# Patient Record
Sex: Female | Born: 1980 | Race: White | Hispanic: No | Marital: Married | Smoking: Former smoker
Health system: Southern US, Community
[De-identification: ages and names within clinical notes are randomized; demographics above are authoritative.]

## PROBLEM LIST (undated history)

## (undated) DIAGNOSIS — R87619 Unspecified abnormal cytological findings in specimens from cervix uteri: Secondary | ICD-10-CM

## (undated) DIAGNOSIS — B977 Papillomavirus as the cause of diseases classified elsewhere: Secondary | ICD-10-CM

## (undated) DIAGNOSIS — F419 Anxiety disorder, unspecified: Secondary | ICD-10-CM

## (undated) DIAGNOSIS — D242 Benign neoplasm of left breast: Secondary | ICD-10-CM

## (undated) DIAGNOSIS — K219 Gastro-esophageal reflux disease without esophagitis: Secondary | ICD-10-CM

## (undated) HISTORY — PX: TONSILLECTOMY AND ADENOIDECTOMY: SUR1326

## (undated) HISTORY — PX: REFRACTIVE SURGERY: SHX103

## (undated) HISTORY — DX: Papillomavirus as the cause of diseases classified elsewhere: B97.7

## (undated) HISTORY — PX: LEEP: SHX91

## (undated) HISTORY — DX: Unspecified abnormal cytological findings in specimens from cervix uteri: R87.619

---

## 2001-10-22 ENCOUNTER — Other Ambulatory Visit: Admission: RE | Admit: 2001-10-22 | Discharge: 2001-10-22 | Payer: Self-pay | Admitting: Gynecology

## 2002-03-19 ENCOUNTER — Other Ambulatory Visit: Admission: RE | Admit: 2002-03-19 | Discharge: 2002-03-19 | Payer: Self-pay | Admitting: Gynecology

## 2003-03-14 ENCOUNTER — Other Ambulatory Visit: Admission: RE | Admit: 2003-03-14 | Discharge: 2003-03-14 | Payer: Self-pay | Admitting: Obstetrics & Gynecology

## 2003-09-26 ENCOUNTER — Other Ambulatory Visit: Admission: RE | Admit: 2003-09-26 | Discharge: 2003-09-26 | Payer: Self-pay | Admitting: Obstetrics and Gynecology

## 2004-03-23 ENCOUNTER — Other Ambulatory Visit: Admission: RE | Admit: 2004-03-23 | Discharge: 2004-03-23 | Payer: Self-pay | Admitting: Obstetrics and Gynecology

## 2005-01-14 ENCOUNTER — Ambulatory Visit (HOSPITAL_COMMUNITY): Admission: RE | Admit: 2005-01-14 | Discharge: 2005-01-14 | Payer: Self-pay | Admitting: General Surgery

## 2006-06-08 ENCOUNTER — Inpatient Hospital Stay (HOSPITAL_COMMUNITY): Admission: AD | Admit: 2006-06-08 | Discharge: 2006-06-08 | Payer: Self-pay | Admitting: *Deleted

## 2006-08-12 ENCOUNTER — Inpatient Hospital Stay (HOSPITAL_COMMUNITY): Admission: AD | Admit: 2006-08-12 | Discharge: 2006-08-12 | Payer: Self-pay | Admitting: Obstetrics & Gynecology

## 2006-08-15 ENCOUNTER — Inpatient Hospital Stay (HOSPITAL_COMMUNITY): Admission: AD | Admit: 2006-08-15 | Discharge: 2006-08-27 | Payer: Self-pay | Admitting: Obstetrics & Gynecology

## 2006-08-25 ENCOUNTER — Encounter (INDEPENDENT_AMBULATORY_CARE_PROVIDER_SITE_OTHER): Payer: Self-pay | Admitting: Specialist

## 2008-11-25 ENCOUNTER — Ambulatory Visit (HOSPITAL_COMMUNITY): Admission: RE | Admit: 2008-11-25 | Discharge: 2008-11-25 | Payer: Self-pay | Admitting: Family Medicine

## 2011-05-03 NOTE — Discharge Summary (Signed)
NAME:  Jodi Coleman, Jodi Coleman                ACCOUNT NO.:  [id number]   MEDICAL RECORD NO.:  [id number]          PATIENT TYPE:  INP   LOCATION:  9121                          FACILITY:  [facility]   PHYSICIAN:  Gerri Spore B. Earlene Plater, M.D.  DATE OF BIRTH:  1981/11/28   DATE OF ADMISSION:  08/15/2006  DATE OF DISCHARGE:  08/27/2006                                 DISCHARGE SUMMARY   ADMISSION DIAGNOSES:  1. A 36-week intrauterine pregnancy.  2. Third trimester bleeding.   DISCHARGE DIAGNOSES:  1. A 36-week intrauterine pregnancy.  2. Third trimester bleeding.   HISTORY OF PRESENT ILLNESS:  A 30 year old white female admitted 36 plus  weeks with third trimester bleeding without evidence of preterm labor.  Ultrasound was normal showing appropriate fluid, normal growth and  reassuring monitoring.  The patient was maintained on bedrest and continuous  monitoring for presumed marginal abruption until 37 weeks when induction was  performed.  The patient was subsequently delivered.  Spontaneous vaginal  delivery, viable female, Apgars 9 and 9.  Placenta submitted to pathology  which did show subchorionic fibrin deposit consistent with abruption.   Postpartum, patient recovered rapidly and was discharged home on the second  postpartum day in satisfactory condition.   DISCHARGE MEDICATIONS:  Motrin 1 to 2 tablets every 4 to 6 hours as needed  for pain.  800 mg every eight hours.   DISCHARGE INSTRUCTIONS:  Standard preprinted instructions given prior to  dismissal.   FOLLOWUP:  [facility] Dr. Earlene Plater in 6 weeks.      Gerri Spore B. Earlene Plater, M.D.  Electronically Signed     WBD/MEDQ  D:  09/22/2006  T:  09/23/2006  Job:  [id number]

## 2011-05-03 NOTE — H&P (Signed)
NAME:  Jodi Coleman, Jodi Coleman NO.:  [id number]   MEDICAL RECORD NO.:  [id number]          PATIENT TYPE:  AMB   LOCATION:  DAY                           FACILITY:  [facility]   PHYSICIAN:  Dalia Heading, M.D.  DATE OF BIRTH:  May 20, 1981   DATE OF ADMISSION:  DATE OF DISCHARGE:  [facility]                                HISTORY & PHYSICAL   CHIEF COMPLAINT:  Hematochezia.   HISTORY OF PRESENT ILLNESS:  The patient is a 30 year old white female who  was referred for endoscopic evaluation.  She needs colonoscopy for  hematochezia.  She has had intermittent episodes of hematochezia since the  age of 49.  Last episode was three weeks ago which turned the toilet bowl  red.  No abnormal pain, weight loss, nausea, vomiting, diarrhea,  constipation, or melena have been noted.  She has never had a colonoscopy.  There is no family history of colon carcinoma.   PAST MEDICAL HISTORY:  Unremarkable.   PAST SURGICAL HISTORY:  Tonsillectomy, Lasik surgery.   CURRENT MEDICATIONS:  1.  Birth control patch.  2.  Multivitamin.  3.  Folic acid.   ALLERGIES:  No known drug allergies.   REVIEW OF SYSTEMS:  Noncontributory.   PHYSICAL EXAMINATION:  GENERAL:  The patient is a well-developed, well-  nourished white female in no acute distress.  VITAL SIGNS:  She is afebrile and vital signs are stable.  LUNGS:  Clear to auscultation with equal breath sounds bilaterally.  HEART:  Regular rate and rhythm without S3, S4, or murmurs.  ABDOMEN:  Soft, nontender, nondistended.  No hepatosplenomegaly or masses  are noted.  RECTAL:  Deferred to the procedure.   IMPRESSION:  Hematochezia.   PLAN:  The patient is scheduled for colonoscopy on January 14, 2005.  The  risks and benefits of the procedure including bleeding and perforation were  fully explained to the patient who gave informed consent.      MAJ/MEDQ  D:  01/10/2005  T:  01/10/2005  Job:  [id number]   cc:   [facility]  Fax:  [phone]   Madelin Rear. Sherwood Gambler, MD  [address]  [place]  [place] [address]  Fax: [phone]

## 2015-09-13 ENCOUNTER — Telehealth: Payer: Self-pay | Admitting: Osteopathic Medicine

## 2015-09-13 ENCOUNTER — Ambulatory Visit (INDEPENDENT_AMBULATORY_CARE_PROVIDER_SITE_OTHER): Payer: BLUE CROSS/BLUE SHIELD | Admitting: Osteopathic Medicine

## 2015-09-13 ENCOUNTER — Ambulatory Visit (INDEPENDENT_AMBULATORY_CARE_PROVIDER_SITE_OTHER): Payer: BLUE CROSS/BLUE SHIELD

## 2015-09-13 ENCOUNTER — Encounter: Payer: Self-pay | Admitting: Osteopathic Medicine

## 2015-09-13 ENCOUNTER — Encounter (INDEPENDENT_AMBULATORY_CARE_PROVIDER_SITE_OTHER): Payer: Self-pay

## 2015-09-13 VITALS — BP 115/83 | HR 81 | Temp 99.8°F | Ht 65.5 in | Wt 137.0 lb

## 2015-09-13 DIAGNOSIS — R05 Cough: Secondary | ICD-10-CM

## 2015-09-13 DIAGNOSIS — R0989 Other specified symptoms and signs involving the circulatory and respiratory systems: Secondary | ICD-10-CM | POA: Diagnosis not present

## 2015-09-13 DIAGNOSIS — R059 Cough, unspecified: Secondary | ICD-10-CM

## 2015-09-13 NOTE — Telephone Encounter (Signed)
Please call pt: chest xray normal

## 2015-09-13 NOTE — Progress Notes (Signed)
HPI: Jodi Coleman is a 34 y.o. female who presents to [place]  today for chief complaint of:  Chief Complaint  Patient presents with  . Establish Care    COUGH   . Severity: mild - moderate . Duration: 2 weeks  . Timing: intermittent . Context: some coughing up yellow phlegm maybe 2 - 3 times per day, scant amount, no asthma history, smoked in early adulthood, quit 2005, no chemical exposure, no secondhand smoke exposure, (+) Jerrye Bushy takes Prilosec "as needed", (+) seasonal allergies mild, (+) sick contacts - son  . Modifying factors: has taken no OTC medications other than emergen-C and cough drops.  . Assoc signs/symptoms: no fever/chills,    Past medical, social and family history reviewed: No past medical history on file. No past surgical history on file. Social History  Substance Use Topics  . Smoking status: Not on file  . Smokeless tobacco: Not on file  . Alcohol Use: Not on file   No family history on file.  No current outpatient prescriptions on file.   No current facility-administered medications for this visit.   Allergies not on file    Review of Systems: CONSTITUTIONAL: Neg fever/chills, no unintentional weight changes HEAD/EYES/EARS/NOSE/THROAT: No headache/vision change or hearing change, no sore throat CARDIAC: No chest pain/pressure/palpitations, no orthopnea RESPIRATORY: (+) cough as per HPI, no shortness of breath/wheeze, occasional chest discomfort with coughing GASTROINTESTINAL: No nausea/vomiting/abdominal pain/diarrhea/constipation, (+) occ blood in stool MUSCULOSKELETAL: No myalgia/arthralgia GENITOURINARY: No incontinence, No abnormal genital bleeding/discharge SKIN: No rash/wounds/concerning lesions HEM/ONC: No easy bruising/bleeding, no abnormal lymph node ENDOCRINE: No polyuria/polydipsia/polyphagia, no heat/cold intolerance  NEUROLOGIC: No weakness/dizzines/slurred speech PSYCHIATRIC: No concerns with  depression/anxiety or sleep problems    Exam:  BP 115/83 mmHg  Pulse 81  Temp(Src) 99.8 F (37.7 C) (Oral)  Ht 5' 5.5" (1.664 m)  Wt 137 lb (62.143 kg)  BMI 22.44 kg/m2 Constitutional: VSS, see above. General Appearance: alert, well-developed, well-nourished, NAD Eyes: Normal lids and conjunctive, non-icteric sclera, PERRLA Ears, Nose, Mouth, Throat: Normal external inspection ears/nares/mouth/lips/gums, Normal TM bilaterally, MMM, posterior pharynx without erythema/exudate Neck: No masses, trachea midline. No thyroid enlargement/tenderness/mass appreciated Respiratory: Normal respiratory effort. no wheeze/rhonchi/rales Cardiovascular: S1/S2 normal, no murmur/rub/gallop auscultated. RRR. No lower extremity edema.  No results found for this or any previous visit (from the past 72 hour(s)).    ASSESSMENT/PLAN:  Cough - Ddx includes viral infection > GERD > seasonal allergic rhinitis/postnasal drip > bronchitis > asthma variant  - Plan: DG Chest 2 View   OTC Prilosec DAILY recommended, can try antihistamine or nasal steroid, will get CXR r/o infiltrate or other such concern, no asthma hx. RTC if no improvement or if symptoms et worse. Pt declines prescription therapy or labs at this time which I would agree with. CXR on personal review appears normal, await radiology overread.

## 2015-09-14 NOTE — Telephone Encounter (Signed)
LMOM notifying pt of results.  

## 2015-09-28 ENCOUNTER — Ambulatory Visit (INDEPENDENT_AMBULATORY_CARE_PROVIDER_SITE_OTHER): Payer: BLUE CROSS/BLUE SHIELD | Admitting: Sports Medicine

## 2015-09-28 VITALS — Temp 98.8°F

## 2015-09-28 DIAGNOSIS — Z23 Encounter for immunization: Secondary | ICD-10-CM

## 2015-12-06 ENCOUNTER — Ambulatory Visit (INDEPENDENT_AMBULATORY_CARE_PROVIDER_SITE_OTHER): Payer: BLUE CROSS/BLUE SHIELD | Admitting: Obstetrics & Gynecology

## 2015-12-06 ENCOUNTER — Encounter: Payer: Self-pay | Admitting: Obstetrics & Gynecology

## 2015-12-06 VITALS — BP 106/72 | HR 86 | Resp 16 | Ht 65.5 in | Wt 137.0 lb

## 2015-12-06 DIAGNOSIS — Z8742 Personal history of other diseases of the female genital tract: Secondary | ICD-10-CM | POA: Diagnosis not present

## 2015-12-06 DIAGNOSIS — Z30431 Encounter for routine checking of intrauterine contraceptive device: Secondary | ICD-10-CM

## 2015-12-06 DIAGNOSIS — Z124 Encounter for screening for malignant neoplasm of cervix: Secondary | ICD-10-CM | POA: Diagnosis not present

## 2015-12-06 DIAGNOSIS — Z01419 Encounter for gynecological examination (general) (routine) without abnormal findings: Secondary | ICD-10-CM

## 2015-12-06 DIAGNOSIS — Z1151 Encounter for screening for human papillomavirus (HPV): Secondary | ICD-10-CM | POA: Diagnosis not present

## 2015-12-06 NOTE — Progress Notes (Signed)
  Subjective:     Jodi Coleman is a 34 y.o. female here for a routine exam.  Current complaints: none.  Has IUD on approx year 4.  Pt 85% sure she is done with child bearing.  Reviewed records from [facility]   Gynecologic History No LMP recorded. Patient is not currently having periods (Reason: IUD). Contraception: IUD Last Pap: 2013. Results were: normal Last mammogram: n/a  Obstetric History OB History  Gravida Para Term Preterm AB SAB TAB Ectopic Multiple Living  1 1 1       1     # Outcome Date GA Lbr Len/2nd Weight Sex Delivery Anes PTL Lv  1 Term 08/25/06 [redacted]w[redacted]d   M Vag-Spont          The following portions of the patient's history were reviewed and updated as appropriate: allergies, current medications, past family history, past medical history, past social history, past surgical history and problem list.  Review of Systems Pertinent items noted in HPI and remainder of comprehensive ROS otherwise negative.    Objective:      Filed Vitals:   12/06/15 0853  BP: 106/72  Pulse: 86  Resp: 16  Height: 5' 5.5" (1.664 m)  Weight: 137 lb (62.143 kg)   Vitals:  WNL General appearance: alert, cooperative and no distress  HEENT: Normocephalic, without obvious abnormality, atraumatic Eyes: negative Throat: lips, mucosa, and tongue normal; teeth and gums normal  Respiratory: Clear to auscultation bilaterally  CV: Regular rate and rhythm  Breasts:  Numerous flesh colored papules on beasts that can emit a white substance.  C/w Montgomery glands  GI: Soft, non-tender; bowel sounds normal; no masses,  no organomegaly  GU: External Genitalia:  Tanner V, no lesion Urethra:  No prolapse   Vagina: Pink, normal rugae, no blood or discharge  Cervix: No CMT, no lesion, IUD strings not seen  Uterus:  Normal size and contour, non tender  Adnexa: Normal, no masses, non tender  Musculoskeletal: No edema, redness or tenderness in the calves or thighs  Skin: No lesions or rash   Lymphatic: Axillary adenopathy: none    Psychiatric: Normal mood and behavior          Assessment:    Healthy female exam.   Nml Montgomery glands on breasts   Plan:    Pap with co testing Bedside SU shows IUD in uterus RTC 1 year

## 2015-12-13 ENCOUNTER — Telehealth: Payer: Self-pay | Admitting: *Deleted

## 2015-12-13 LAB — CYTOLOGY - PAP

## 2015-12-13 NOTE — Telephone Encounter (Signed)
-----   Message from Guss Bunde, MD sent at 12/13/2015 12:06 PM EST ----- Pt needs colposcopy

## 2015-12-13 NOTE — Telephone Encounter (Signed)
LM on voicemail to call office to schedule a colposcopy per Dr leggett for ASCUS with HPV

## 2016-01-02 ENCOUNTER — Encounter: Payer: BLUE CROSS/BLUE SHIELD | Admitting: Obstetrics & Gynecology

## 2016-01-09 ENCOUNTER — Ambulatory Visit (INDEPENDENT_AMBULATORY_CARE_PROVIDER_SITE_OTHER): Payer: BLUE CROSS/BLUE SHIELD | Admitting: Obstetrics & Gynecology

## 2016-01-09 ENCOUNTER — Encounter: Payer: Self-pay | Admitting: Obstetrics & Gynecology

## 2016-01-09 VITALS — Resp 16 | Ht 65.5 in

## 2016-01-09 DIAGNOSIS — R87810 Cervical high risk human papillomavirus (HPV) DNA test positive: Secondary | ICD-10-CM | POA: Diagnosis not present

## 2016-01-09 DIAGNOSIS — F430 Acute stress reaction: Secondary | ICD-10-CM | POA: Diagnosis not present

## 2016-01-09 DIAGNOSIS — R87610 Atypical squamous cells of undetermined significance on cytologic smear of cervix (ASC-US): Secondary | ICD-10-CM | POA: Diagnosis not present

## 2016-01-09 DIAGNOSIS — IMO0002 Reserved for concepts with insufficient information to code with codable children: Secondary | ICD-10-CM

## 2016-01-09 DIAGNOSIS — F411 Generalized anxiety disorder: Secondary | ICD-10-CM | POA: Diagnosis not present

## 2016-01-09 DIAGNOSIS — Z01812 Encounter for preprocedural laboratory examination: Secondary | ICD-10-CM

## 2016-01-09 DIAGNOSIS — Z8742 Personal history of other diseases of the female genital tract: Secondary | ICD-10-CM | POA: Diagnosis not present

## 2016-01-09 LAB — POCT URINE PREGNANCY: Preg Test, Ur: NEGATIVE

## 2016-01-09 MED ORDER — ALPRAZOLAM 0.25 MG PO TABS: 0.2500 mg | ORAL_TABLET | Freq: Every evening | ORAL | Status: DC | PRN

## 2016-01-09 NOTE — Patient Instructions (Signed)
Pt's father committed suicide 12/29/15.  Pt is very tearful today and requesting that Dr Gala Romney give her something for the anxiety.  Per Vo Dr Gala Romney will give her Xanax .25 mg # 10 to take prn.  Pt was made aware that if she feels like she should need any RF's she would need to see her PCP as Dr Gala Romney does not do RF's on benzodiazepines.

## 2016-01-10 NOTE — Progress Notes (Signed)
   Subjective:    Patient ID: Jodi Coleman, female    DOB: 05-28-1981, 35 y.o.   MRN: [id number]  HPI Pt presents for colposcopy.  She is not having any gyn complaints.  She also reports that her father committed suicide 2 weeks ago.  She is having problems sleeping and feelings of being anxious.  She denies depression, SI, HI.  She is requesting a short course of anxiolytics to get through this particularly difficult time.     Review of Systems  Constitutional: Negative.   Respiratory: Negative.   Genitourinary: Negative.   Psychiatric/Behavioral: Positive for agitation.       Objective:   Physical Exam  Constitutional: She is oriented to person, place, and time. She appears well-developed and well-nourished. No distress.  HENT:  Head: Normocephalic and atraumatic.  Eyes: Conjunctivae are normal.  Abdominal: Soft. There is no tenderness.  Genitourinary: Vagina normal. No vaginal discharge found.  Neurological: She is alert and oriented to person, place, and time.  Skin: Skin is warm and dry.  Psychiatric: She has a normal mood and affect.  Vitals reviewed.     Colposcopy Procedure Note  Indications: Pap smear 1 months ago showed: ASCUS with POSITIVE high risk HPV. The prior pap showed no abnormalities.  Prior cervical/vaginal disease: HPV related changes. Prior cervical treatment: no treatment.  Procedure Details  The risks and benefits of the procedure and Written informed consent obtained.  Speculum placed in vagina and excellent visualization of cervix achieved, cervix swabbed x 3 with acetic acid solution.  Findings: Cervix: HPV changes noted diffusely.  Small AWE area at 3 o'clock; cervix swabbed with Lugol's solution, SCJ visualized 360 degrees without lesions, SCJ visualized - lesion at 3 o'clock, endocervical curettage performed, cervical biopsies taken at 3 o'clock, specimen labelled and sent to pathology and hemostasis achieved with Monsel's  solution. Vaginal inspection: vaginal colposcopy not performed. Vulvar colposcopy: vulvar colposcopy not performed.  Specimens: ECC and biopsy at 3 o'clock  Complications: none.  Plan: Specimens labelled and sent to Pathology. Will base further treatment on Pathology findings.    Assessment & Plan:  Cervical dysplasia--colposcopy as above; will base treatment on pathology  Anxiety due to recent suicide of father--Xanax 0.5 mg 1 tablet bid prn given.  #10 pills.  If patient requires further medication or has signs of depression, then she will need to f/u with her PCP.

## 2016-01-12 ENCOUNTER — Telehealth: Payer: Self-pay | Admitting: *Deleted

## 2016-01-12 NOTE — Telephone Encounter (Signed)
Pt notified of biopsy results and she will rtn in 1 year.

## 2016-01-12 NOTE — Telephone Encounter (Signed)
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  Pt needs pap and HPV testing in 1 year.  RN or CMA to call.

## 2016-04-17 ENCOUNTER — Encounter: Payer: Self-pay | Admitting: Family Medicine

## 2016-04-17 ENCOUNTER — Ambulatory Visit (INDEPENDENT_AMBULATORY_CARE_PROVIDER_SITE_OTHER): Payer: BLUE CROSS/BLUE SHIELD | Admitting: Family Medicine

## 2016-04-17 VITALS — BP 116/82 | HR 91 | Temp 98.8°F | Wt 138.0 lb

## 2016-04-17 DIAGNOSIS — F411 Generalized anxiety disorder: Secondary | ICD-10-CM

## 2016-04-17 DIAGNOSIS — J069 Acute upper respiratory infection, unspecified: Secondary | ICD-10-CM | POA: Diagnosis not present

## 2016-04-17 MED ORDER — IPRATROPIUM BROMIDE 0.06 % NA SOLN: 2.0000 | NASAL | Status: DC | PRN

## 2016-04-17 MED ORDER — SERTRALINE HCL 25 MG PO TABS: 25.0000 mg | ORAL_TABLET | Freq: Every day | ORAL | Status: DC

## 2016-04-17 NOTE — Assessment & Plan Note (Signed)
Supportive care with over-the-counter medications discussed with patient Red flag symptoms discussed with patient and told to return to clinic if worsening symptoms. atrovent as below.

## 2016-04-17 NOTE — Progress Notes (Signed)
Jodi Coleman is a 35 y.o. female who presents to [place] [facility] today for   1)  URI symptoms which started a couple of days ago. She complains of low-grade fever, sinus pressure and headaches well as mild sore throat.  She denies one-sided face pain. She has not taken anything for these symptoms. She's had no recent sick contacts, but is worried today that she has strep throat  2) Mixed anxiety and depression feelings.  Patient presents today with complaints of feeling overwhelmingly anxious and down at times for several months now. Her father had committed suicide several months ago as well as she lost an aunt who was close to her.  She also has stressors related to work as well as home life. She has never been on any medicine for this in the past and has never seen a psychologist or counselor.  She denies any suicidal or homicidal ideations.  Asking about medications to help with her symptoms today.  Her OB/GYN did put her on Xanax for short time following the suicide of her father. Patient states this did not work very well though, and she did not really notice much of a difference on it.  GAD 7 done in the office today revealed she feels anxious nervous or on edge nearly every day, not being able to stop or control her worry every day, worrying too much about different things every day, difficulty relaxing every day. Also being restless and hard to sit still most days of the week. She also complains of being irritable and feeling afraid is something awful might happen. Total score was 0+0+4+15 for a total of 19 today.  PHQ-9 done in the office today revealed several days of little interest or pleasure in doing things, feeling down and depressed and hopeless several days a week, severe symptoms of difficulty falling or staying asleep, feeling tired and having little energy as well as feeling like a failure.  She  also complains of trouble concentrating.  Her total score today was 0+5+6+3 for total score of 14   Past Medical History  Diagnosis Date  . HPV (human papilloma virus) infection   . Abnormal Pap smear of cervix    Past Surgical History  Procedure Laterality Date  . Tonsillectomy and adenoidectomy    . Refractive surgery     Social History  Substance Use Topics  . Smoking status: Former Research scientist (life sciences)  . Smokeless tobacco: Never Used     Comment: QUIT IN 2005  . Alcohol Use: 0.0 oz/week    0 Standard drinks or equivalent per week     Comment: wine occassionally   family history includes Alcohol abuse in her father; Cancer in her maternal aunt and maternal grandfather; Depression in her father and mother; Diabetes in her maternal grandmother; Heart attack in her paternal grandmother; Hypertension in her father; Stroke in her paternal grandmother.  ROS: See HPI above for pertinent positives and negatives   Medications: Current Outpatient Prescriptions  Medication Sig Dispense Refill  . esomeprazole (NEXIUM) 20 MG capsule Take 20 mg by mouth daily at 12 noon.    Marland Kitchen levonorgestrel (MIRENA) 20 MCG/24HR IUD 1 each by Intrauterine route once.    Marland Kitchen ipratropium (ATROVENT) 0.06 % nasal spray Place 2 sprays into both nostrils every 4 (four) hours as needed for rhinitis. 10 mL 6  . sertraline (ZOLOFT) 25 MG tablet Take 1 tablet (25 mg total) by mouth daily. [place]  tablet 0   No current facility-administered medications for this visit.   No Known Allergies   Exam:  BP 116/82 mmHg  Pulse 91  Temp(Src) 98.8 F (37.1 C) (Oral)  Wt 138 lb (62.596 kg)  SpO2 100% Gen: Well NAD Heent: [place]/AT, EOMI.  TMs bilaterally show signs of scarring, no erythema, effusion or acute findings.  OP: Mild erythema and cobblestoning posterior oropharynx Lungs: Normal work of breathing. CTA B/L, no wheeze Heart: RRR no MRG Abd: No gross distention. Exts: Warm and dry, capillary refill less than 2 seconds, well  perfused.  Psych: Alert and oriented normal speech thought process and affect. No SI or HI expressed.  No results found for this or any previous visit (from the past 24 hour(s)). No results found.   Please see individual assessment and plan sections.

## 2016-04-17 NOTE — Patient Instructions (Addendum)
Thank you for coming in today. Call or go to the emergency room if you get worse, have trouble breathing, have chest pains, or palpitations.  Return in 1-2 weeks.  You should hear from a clinic about counseling or therapy. Start Zoloft.  Use atrovent nasal spray and over the counter medicine.   Sertraline tablets What is this medicine? SERTRALINE (SER tra leen) is used to treat depression. It may also be used to treat obsessive compulsive disorder, panic disorder, post-trauma stress, premenstrual dysphoric disorder (PMDD) or social anxiety. This medicine may be used for other purposes; ask your health care provider or pharmacist if you have questions. What should I tell my health care provider before I take this medicine? They need to know if you have any of these conditions: -bipolar disorder or a family history of bipolar disorder -diabetes -glaucoma -heart disease -high blood pressure -history of irregular heartbeat -history of low levels of calcium, magnesium, or potassium in the blood -if you often drink alcohol -liver disease -receiving electroconvulsive therapy -seizures -suicidal thoughts, plans, or attempt; a previous suicide attempt by you or a family member -thyroid disease -an unusual or allergic reaction to sertraline, other medicines, foods, dyes, or preservatives -pregnant or trying to get pregnant -breast-feeding How should I use this medicine? Take this medicine by mouth with a glass of water. Follow the directions on the prescription label. You can take it with or without food. Take your medicine at regular intervals. Do not take your medicine more often than directed. Do not stop taking this medicine suddenly except upon the advice of your doctor. Stopping this medicine too quickly may cause serious side effects or your condition may worsen. A special MedGuide will be given to you by the pharmacist with each prescription and refill. Be sure to read this information  carefully each time. Talk to your pediatrician regarding the use of this medicine in children. While this drug may be prescribed for children as young as 7 years for selected conditions, precautions do apply. Overdosage: If you think you have taken too much of this medicine contact a poison control center or emergency room at once. NOTE: This medicine is only for you. Do not share this medicine with others. What if I miss a dose? If you miss a dose, take it as soon as you can. If it is almost time for your next dose, take only that dose. Do not take double or extra doses. What may interact with this medicine? Do not take this medicine with any of the following medications: -certain medicines for fungal infections like fluconazole, itraconazole, ketoconazole, posaconazole, voriconazole -cisapride -disulfiram -dofetilide -linezolid -MAOIs like Carbex, Eldepryl, Marplan, Nardil, and Parnate -metronidazole -methylene blue (injected into a vein) -pimozide -thioridazine -ziprasidone This medicine may also interact with the following medications: -alcohol -aspirin and aspirin-like medicines -certain medicines for depression, anxiety, or psychotic disturbances -certain medicines for irregular heart beat like flecainide, propafenone -certain medicines for migraine headaches like almotriptan, eletriptan, frovatriptan, naratriptan, rizatriptan, sumatriptan, zolmitriptan -certain medicines for sleep -certain medicines for seizures like carbamazepine, valproic acid, phenytoin -certain medicines that treat or prevent blood clots like warfarin, enoxaparin, dalteparin -cimetidine -digoxin -diuretics -fentanyl -furazolidone -isoniazid -lithium -NSAIDs, medicines for pain and inflammation, like ibuprofen or naproxen -other medicines that prolong the QT interval (cause an abnormal heart rhythm) -procarbazine -rasagiline -supplements like St. John's wort, kava kava,  valerian -tolbutamide -tramadol -tryptophan This list may not describe all possible interactions. Give your health care provider a list of all the  medicines, herbs, non-prescription drugs, or dietary supplements you use. Also tell them if you smoke, drink alcohol, or use illegal drugs. Some items may interact with your medicine. What should I watch for while using this medicine? Tell your doctor if your symptoms do not get better or if they get worse. Visit your doctor or health care professional for regular checks on your progress. Because it may take several weeks to see the full effects of this medicine, it is important to continue your treatment as prescribed by your doctor. Patients and their families should watch out for new or worsening thoughts of suicide or depression. Also watch out for sudden changes in feelings such as feeling anxious, agitated, panicky, irritable, hostile, aggressive, impulsive, severely restless, overly excited and hyperactive, or not being able to sleep. If this happens, especially at the beginning of treatment or after a change in dose, call your health care professional. Dennis Bast may get drowsy or dizzy. Do not drive, use machinery, or do anything that needs mental alertness until you know how this medicine affects you. Do not stand or sit up quickly, especially if you are an older patient. This reduces the risk of dizzy or fainting spells. Alcohol may interfere with the effect of this medicine. Avoid alcoholic drinks. Your mouth may get dry. Chewing sugarless gum or sucking hard candy, and drinking plenty of water may help. Contact your doctor if the problem does not go away or is severe. What side effects may I notice from receiving this medicine? Side effects that you should report to your doctor or health care professional as soon as possible: -allergic reactions like skin rash, itching or hives, swelling of the face, lips, or tongue -black or bloody stools, blood in the  urine or vomit -fast, irregular heartbeat -feeling faint or lightheaded, falls -hallucination, loss of contact with reality -seizures -suicidal thoughts or other mood changes -unusual bleeding or bruising -unusually weak or tired -vomiting Side effects that usually do not require medical attention (report to your doctor or health care professional if they continue or are bothersome): -change in appetite -change in sex drive or performance -diarrhea -increased sweating -indigestion, nausea -tremors This list may not describe all possible side effects. Call your doctor for medical advice about side effects. You may report side effects to FDA at 1-800-FDA-1088. Where should I keep my medicine? Keep out of the reach of children. Store at room temperature between 15 and 30 degrees C (59 and 86 degrees F). Throw away any unused medicine after the expiration date. NOTE: This sheet is a summary. It may not cover all possible information. If you have questions about this medicine, talk to your doctor, pharmacist, or health care provider.    2016, Elsevier/Gold Standard. (2013-06-29 12:57:35)

## 2016-04-17 NOTE — Assessment & Plan Note (Addendum)
Referral for psychology or licensed social work visit  Trial of low-dose Zoloft '  Return to clinic 1-2 weeks for follow-up

## 2016-05-02 ENCOUNTER — Ambulatory Visit (INDEPENDENT_AMBULATORY_CARE_PROVIDER_SITE_OTHER): Payer: BLUE CROSS/BLUE SHIELD | Admitting: Family Medicine

## 2016-05-02 ENCOUNTER — Encounter: Payer: Self-pay | Admitting: Family Medicine

## 2016-05-02 VITALS — BP 113/78 | HR 79 | Wt 140.0 lb

## 2016-05-02 DIAGNOSIS — F411 Generalized anxiety disorder: Secondary | ICD-10-CM

## 2016-05-02 MED ORDER — SERTRALINE HCL 50 MG PO TABS: 50.0000 mg | ORAL_TABLET | Freq: Every day | ORAL | Status: DC

## 2016-05-02 NOTE — Progress Notes (Signed)
       Jodi Coleman is a 35 y.o. female who presents to [facility] today for follow-up of depression and anxiety. Patient was seen about 2 weeks ago for depression and anxiety. She was started on Zoloft 25 mg daily and referred for counseling. Her first available appointment for the counseling referred her to is not until late June. However she is feeling much better. She's sleeping better and is pretty satisfied with how things are going. No SI or HI.   Past Medical History  Diagnosis Date  . HPV (human papilloma virus) infection   . Abnormal Pap smear of cervix    Past Surgical History  Procedure Laterality Date  . Tonsillectomy and adenoidectomy    . Refractive surgery     Social History  Substance Use Topics  . Smoking status: Former Research scientist (life sciences)  . Smokeless tobacco: Never Used     Comment: QUIT IN 2005  . Alcohol Use: 0.0 oz/week    0 Standard drinks or equivalent per week     Comment: wine occassionally   family history includes Alcohol abuse in her father; Cancer in her maternal aunt and maternal grandfather; Depression in her father and mother; Diabetes in her maternal grandmother; Heart attack in her paternal grandmother; Hypertension in her father; Stroke in her paternal grandmother.  ROS as above Medications: Current Outpatient Prescriptions  Medication Sig Dispense Refill  . levonorgestrel (MIRENA) 20 MCG/24HR IUD 1 each by Intrauterine route once.    . sertraline (ZOLOFT) 50 MG tablet Take 1 tablet (50 mg total) by mouth daily. 30 tablet 1   No current facility-administered medications for this visit.   No Known Allergies   Exam:  BP 113/78 mmHg  Pulse 79  Wt 140 lb (63.504 kg) Gen: Well NAD Psych: Alert and oriented normal speech thought process and affect. No SI or HI expressed.  GAD 7 is 8  No results found for this or any previous visit (from the  past 24 hour(s)). No results found.   35 year old woman with anxiety and depression improving. Increase Zoloft to 50 mg. Recommend patient search out another counseling group that may be able to see her sooner. Recheck in 2-4 weeks with Dr. Sheppard Coil or myself.Marland Kitchen Ultimate Zoloft goal will likely be 100 mg daily.

## 2016-05-02 NOTE — Patient Instructions (Signed)
Thank you for coming in today. Increase to 50 mg.  Return in 2-4 week with myself or Dr Sheppard Coil.  Continue to pursue counseling.

## 2016-05-31 ENCOUNTER — Encounter: Payer: Self-pay | Admitting: Family Medicine

## 2016-05-31 ENCOUNTER — Ambulatory Visit (INDEPENDENT_AMBULATORY_CARE_PROVIDER_SITE_OTHER): Payer: BLUE CROSS/BLUE SHIELD | Admitting: Family Medicine

## 2016-05-31 VITALS — BP 101/69 | HR 67 | Wt 138.0 lb

## 2016-05-31 DIAGNOSIS — F329 Major depressive disorder, single episode, unspecified: Secondary | ICD-10-CM | POA: Insufficient documentation

## 2016-05-31 DIAGNOSIS — F332 Major depressive disorder, recurrent severe without psychotic features: Secondary | ICD-10-CM

## 2016-05-31 DIAGNOSIS — R21 Rash and other nonspecific skin eruption: Secondary | ICD-10-CM | POA: Insufficient documentation

## 2016-05-31 MED ORDER — CITALOPRAM HYDROBROMIDE 20 MG PO TABS: 20.0000 mg | ORAL_TABLET | Freq: Every day | ORAL | Status: DC

## 2016-05-31 MED ORDER — TRIAMCINOLONE ACETONIDE 0.5 % EX CREA: 1.0000 "application " | TOPICAL_CREAM | Freq: Two times a day (BID) | CUTANEOUS | Status: DC

## 2016-05-31 MED ORDER — TRAZODONE HCL 50 MG PO TABS: 25.0000 mg | ORAL_TABLET | Freq: Every evening | ORAL | Status: DC | PRN

## 2016-05-31 NOTE — Patient Instructions (Signed)
Thank you for coming in today. STOP zoloft. Reduce dose to 1/2 pill for 2 days.  On Sunday start taking 1/2 pill of celexa for 2 days then increase the dose to a full pill.  Use trazodone at night as needed for insomnia.  Use the cream on the rash as needed.

## 2016-05-31 NOTE — Progress Notes (Signed)
       Jodi Coleman is a 35 y.o. female who presents to [place]: [place] today for follow-up depression discuss insomnia anxiety and new rash.  1) depression and anxiety: Improved with Zoloft. She takes 50 mg daily. She notes continued bothersome insomnia symptoms however. No significant SI or HI.  2) rash: Patient notes a mild erythematous rash around her scalp since starting taking the Zoloft. She is not sure if it's related or not. She denies any fevers or chills or flulike illness. She's not tried any treatment yet.    Past Medical History  Diagnosis Date  . HPV (human papilloma virus) infection   . Abnormal Pap smear of cervix    Past Surgical History  Procedure Laterality Date  . Tonsillectomy and adenoidectomy    . Refractive surgery     Social History  Substance Use Topics  . Smoking status: Former Research scientist (life sciences)  . Smokeless tobacco: Never Used     Comment: QUIT IN 2005  . Alcohol Use: 0.0 oz/week    0 Standard drinks or equivalent per week     Comment: wine occassionally   family history includes Alcohol abuse in her father; Cancer in her maternal aunt and maternal grandfather; Depression in her father and mother; Diabetes in her maternal grandmother; Heart attack in her paternal grandmother; Hypertension in her father; Stroke in her paternal grandmother.  ROS as above:  Medications: Current Outpatient Prescriptions  Medication Sig Dispense Refill  . levonorgestrel (MIRENA) 20 MCG/24HR IUD 1 each by Intrauterine route once.    . citalopram (CELEXA) 20 MG tablet Take 1 tablet (20 mg total) by mouth daily. 30 tablet 1  . traZODone (DESYREL) 50 MG tablet Take 0.5-1 tablets (25-50 mg total) by mouth at bedtime as needed for sleep. 30 tablet 3  . triamcinolone cream (KENALOG) 0.5 % Apply 1 application topically 2 (two) times daily. To affected areas. 30 g 3   No  current facility-administered medications for this visit.   Allergies  Allergen Reactions  . Zoloft [Sertraline Hcl] Rash    Maybe due to Zoloft     Exam:  BP 101/69 mmHg  Pulse 67  Wt 138 lb (62.596 kg) Gen: Well NAD HEENT: EOMI,  MMM Lungs: Normal work of breathing. CTABL Heart: RRR no MRG Abd: NABS, Soft. Nondistended, Nontender Exts: Brisk capillary refill, warm and well perfused.  Psych: Alert and oriented normal speech thought process and affect. Skin: Maculopapular erythematous small lesions around scalp and neck nontender. Blanchable.  PHQ9 is 15 GAD 7 is 10  No results found for this or any previous visit (from the past 24 hour(s)). No results found.    Assessment and Plan: 34 y.o. female with  1) major depression and anxiety slight improved. Patient appears to be allergic to Zoloft. Will switch to Celexa. Recheck in one month 2) insomnia: We'll try trazodone 3) rash: Unclear etiology. I'm not entirely sure this is a drug allergy however is reasonable to switch. Switch to Celexa. Treat rash with triamcinolone cream. Recheck in a month.   Discussed warning signs or symptoms. Please see discharge instructions. Patient expresses understanding.

## 2016-06-05 ENCOUNTER — Ambulatory Visit: Payer: BLUE CROSS/BLUE SHIELD | Admitting: Psychology

## 2016-07-03 ENCOUNTER — Ambulatory Visit (INDEPENDENT_AMBULATORY_CARE_PROVIDER_SITE_OTHER): Payer: BLUE CROSS/BLUE SHIELD | Admitting: Family Medicine

## 2016-07-03 ENCOUNTER — Encounter: Payer: Self-pay | Admitting: Family Medicine

## 2016-07-03 VITALS — BP 110/75 | HR 71 | Wt 139.0 lb

## 2016-07-03 DIAGNOSIS — F411 Generalized anxiety disorder: Secondary | ICD-10-CM | POA: Diagnosis not present

## 2016-07-03 DIAGNOSIS — F332 Major depressive disorder, recurrent severe without psychotic features: Secondary | ICD-10-CM | POA: Diagnosis not present

## 2016-07-03 MED ORDER — TRAZODONE HCL 50 MG PO TABS: 25.0000 mg | ORAL_TABLET | Freq: Every evening | ORAL | Status: DC | PRN

## 2016-07-03 MED ORDER — CITALOPRAM HYDROBROMIDE 20 MG PO TABS: 20.0000 mg | ORAL_TABLET | Freq: Every day | ORAL | Status: DC

## 2016-07-03 NOTE — Progress Notes (Signed)
       Jodi Coleman is a 35 y.o. female who presents to [place]: [place] today for follow-up anxiety and depression. Patient is doing quite well on Celexa and trazodone. She notes her symptoms are improved and she sleeping well. She is seeing a therapist and feels that is going well. Overall she says that she is improved.   Past Medical History  Diagnosis Date  . HPV (human papilloma virus) infection   . Abnormal Pap smear of cervix    Past Surgical History  Procedure Laterality Date  . Tonsillectomy and adenoidectomy    . Refractive surgery     Social History  Substance Use Topics  . Smoking status: Former Research scientist (life sciences)  . Smokeless tobacco: Never Used     Comment: QUIT IN 2005  . Alcohol Use: 0.0 oz/week    0 Standard drinks or equivalent per week     Comment: wine occassionally   family history includes Alcohol abuse in her father; Cancer in her maternal aunt and maternal grandfather; Depression in her father and mother; Diabetes in her maternal grandmother; Heart attack in her paternal grandmother; Hypertension in her father; Stroke in her paternal grandmother.  ROS as above:  Medications: Current Outpatient Prescriptions  Medication Sig Dispense Refill  . citalopram (CELEXA) 20 MG tablet Take 1 tablet (20 mg total) by mouth daily. 90 tablet 1  . levonorgestrel (MIRENA) 20 MCG/24HR IUD 1 each by Intrauterine route once.    . traZODone (DESYREL) 50 MG tablet Take 0.5-1 tablets (25-50 mg total) by mouth at bedtime as needed for sleep. 90 tablet 1  . triamcinolone cream (KENALOG) 0.5 % Apply 1 application topically 2 (two) times daily. To affected areas. 30 g 3   No current facility-administered medications for this visit.   Allergies  Allergen Reactions  . Zoloft [Sertraline Hcl] Rash    Maybe due to Zoloft     Exam:  BP 110/75 mmHg  Pulse 71  Wt 139 lb (63.05  kg) Gen: Well NAD Psych: Alert and oriented normal speech thought process and affect.  Depression screen PHQ 2/9 07/03/2016  Decreased Interest 1  Down, Depressed, Hopeless 1  PHQ - 2 Score 2  Altered sleeping 2  Tired, decreased energy 2  Change in appetite 1  Feeling bad or failure about yourself  1  Trouble concentrating 2  Moving slowly or fidgety/restless 1  Suicidal thoughts 0  PHQ-9 Score 11  Difficult doing work/chores Somewhat difficult    GAD 7 : Generalized Anxiety Score 07/03/2016  Nervous, Anxious, on Edge 1  Control/stop worrying 1  Worry too much - different things 1  Trouble relaxing 1  Restless 0  Easily annoyed or irritable 2  Afraid - awful might happen 1  Total GAD 7 Score 7  Anxiety Difficulty Somewhat difficult       No results found for this or any previous visit (from the past 24 hour(s)). No results found.    Assessment and Plan: 35 y.o. female with improving depression and anxiety and insomnia. Continue current medication treatment. Follow-up with counseling/therapy. Recheck in 3-6 months. Return sooner if needed.  Discussed warning signs or symptoms. Please see discharge instructions. Patient expresses understanding.

## 2016-07-03 NOTE — Patient Instructions (Signed)
Thank you for coming in today. Return in 3-6 months or sooner if needed.  Continue counseling.\ Continue medications.

## 2016-08-07 ENCOUNTER — Other Ambulatory Visit: Payer: Self-pay | Admitting: Emergency Medicine

## 2016-11-17 ENCOUNTER — Other Ambulatory Visit: Payer: Self-pay | Admitting: Family Medicine

## 2016-12-06 ENCOUNTER — Ambulatory Visit (INDEPENDENT_AMBULATORY_CARE_PROVIDER_SITE_OTHER): Payer: BLUE CROSS/BLUE SHIELD | Admitting: Family

## 2016-12-06 ENCOUNTER — Encounter: Payer: Self-pay | Admitting: Family

## 2016-12-06 VITALS — BP 95/72 | HR 64 | Ht 65.0 in | Wt 142.0 lb

## 2016-12-06 DIAGNOSIS — Z0000 Encounter for general adult medical examination without abnormal findings: Secondary | ICD-10-CM | POA: Diagnosis not present

## 2016-12-06 DIAGNOSIS — Z124 Encounter for screening for malignant neoplasm of cervix: Secondary | ICD-10-CM

## 2016-12-06 DIAGNOSIS — Z1151 Encounter for screening for human papillomavirus (HPV): Secondary | ICD-10-CM | POA: Diagnosis not present

## 2016-12-06 DIAGNOSIS — Z01419 Encounter for gynecological examination (general) (routine) without abnormal findings: Secondary | ICD-10-CM | POA: Diagnosis not present

## 2016-12-06 DIAGNOSIS — Z30433 Encounter for removal and reinsertion of intrauterine contraceptive device: Secondary | ICD-10-CM | POA: Diagnosis not present

## 2016-12-06 DIAGNOSIS — R87613 High grade squamous intraepithelial lesion on cytologic smear of cervix (HGSIL): Secondary | ICD-10-CM | POA: Diagnosis not present

## 2016-12-06 NOTE — Progress Notes (Signed)
Subjective:     Jodi Coleman is a 35 y.o. female here for a routine exam.  Current complaints: None, desires to have Mirena removed and replaced.  Personal health questionnaire reviewed: yes.   Gynecologic History No LMP recorded. Patient is not currently having periods (Reason: IUD). Contraception: IUD Last Pap: December 2016, . Results were: ACCUS w/HRHPV Last mammogram: n/a  Obstetric History OB History  Gravida Para Term Preterm AB Living  1 1 1     1   SAB TAB Ectopic Multiple Live Births               # Outcome Date GA Lbr Len/2nd Weight Sex Delivery Anes PTL Lv  1 Term 08/25/06 [redacted]w[redacted]d   M Vag-Spont          The following portions of the patient's history were reviewed and updated as appropriate: allergies, current medications, past family history, past medical history, past social history, past surgical history and problem list.  Review of Systems Pertinent items are noted in HPI.    Objective:   BP 95/72   Pulse 64   Ht 5\' 5"  (1.651 m)   Wt 142 lb (64.4 kg)   BMI 23.63 kg/m  General appearance: alert, cooperative and appears stated age Head: Normocephalic, without obvious abnormality, atraumatic Neck: no adenopathy, no carotid bruit, no JVD, supple, symmetrical, trachea midline and thyroid not enlarged, symmetric, no tenderness/mass/nodules Lungs: clear to auscultation bilaterally Breasts: normal appearance, no masses or tenderness, No nipple retraction or dimpling, No nipple discharge or bleeding, No axillary or supraclavicular adenopathy, Normal to palpation without dominant masses, Taught monthly breast self examination Heart: regular rate and rhythm, S1, S2 normal, no murmur, click, rub or gallop Abdomen: soft, non-tender; bowel sounds normal; no masses,  no organomegaly Pelvic: cervix normal in appearance, external genitalia normal, no adnexal masses or tenderness, no cervical motion tenderness, rectovaginal septum normal, uterus normal size, shape, and  consistency and vagina normal without discharge Skin: Skin color, texture, turgor normal. No rashes or lesions      PROCEDURE NOTE: Pt consented for IUD removal.  Pt placed in lithotomy position.  Speculum inserted and IUD removed without difficulty.  Cervix cleaned with betadine solution.  Uterus sounded to 6 cm; IUD inserted without difficulty.  Strings cut at approximately 3 cm.  Speculum removed.  Assessment:    Healthy female exam.  IUD removal IUD insertion Plan:  Pap smear sent to lab  Contraception: IUD.    Return in 4-6 weeks for string check  Gwen Pounds, CNM

## 2016-12-06 NOTE — Addendum Note (Signed)
Addended by: Lyndal Rainbow on: 12/06/2016 10:49 AM   Modules accepted: Orders

## 2016-12-11 LAB — CYTOLOGY - PAP
DIAGNOSIS: HIGH — AB
HPV (WINDOPATH): DETECTED — AB

## 2016-12-12 ENCOUNTER — Telehealth: Payer: Self-pay | Admitting: *Deleted

## 2016-12-12 NOTE — Telephone Encounter (Signed)
Pt notified of abnormal pap and appt made for a colposcopy with Dr Gala Romney.

## 2017-01-01 ENCOUNTER — Encounter: Payer: BLUE CROSS/BLUE SHIELD | Admitting: Obstetrics & Gynecology

## 2017-01-15 ENCOUNTER — Encounter: Payer: Self-pay | Admitting: *Deleted

## 2017-01-15 ENCOUNTER — Ambulatory Visit (INDEPENDENT_AMBULATORY_CARE_PROVIDER_SITE_OTHER): Payer: BLUE CROSS/BLUE SHIELD | Admitting: Obstetrics & Gynecology

## 2017-01-15 ENCOUNTER — Encounter: Payer: Self-pay | Admitting: Obstetrics & Gynecology

## 2017-01-15 VITALS — BP 116/84 | HR 67 | Resp 16 | Ht 66.0 in | Wt 145.0 lb

## 2017-01-15 DIAGNOSIS — R87613 High grade squamous intraepithelial lesion on cytologic smear of cervix (HGSIL): Secondary | ICD-10-CM

## 2017-01-15 DIAGNOSIS — N870 Mild cervical dysplasia: Secondary | ICD-10-CM | POA: Diagnosis not present

## 2017-01-15 DIAGNOSIS — Z3202 Encounter for pregnancy test, result negative: Secondary | ICD-10-CM

## 2017-01-15 DIAGNOSIS — Z9889 Other specified postprocedural states: Secondary | ICD-10-CM

## 2017-01-15 LAB — POCT URINE PREGNANCY: Preg Test, Ur: NEGATIVE

## 2017-01-15 NOTE — Progress Notes (Signed)
Colposcopy Procedure Note  Indications: Pap smear 1 months ago showed: high-grade squamous intraepithelial neoplasia  (HGSIL-encompassing moderate and severe dysplasia). The prior pap showed no abnormalities.  Prior cervical/vaginal disease: CIN 1. Prior cervical treatment: no treatment.  Procedure Details  The risks and benefits of the procedure and Written informed consent obtained.  Speculum placed in vagina and excellent visualization of cervix achieved, cervix swabbed x 3 with acetic acid solution.  Findings: Cervix: mosaicism and AWE around 9 nd 3 c'clock that extends away from the os.  IUD strings intact.  AWE at 12 o'clock Vaginal inspection: vaginal colposcopy not performed. Vulvar colposcopy: vulvar colposcopy not performed.  Specimens: separate biopsies at 9,12, & 3 o'clock  Complications: none.  Plan: Will base further treatment on Pathology findings.

## 2017-01-27 ENCOUNTER — Encounter: Payer: Self-pay | Admitting: Obstetrics & Gynecology

## 2017-01-27 ENCOUNTER — Ambulatory Visit (INDEPENDENT_AMBULATORY_CARE_PROVIDER_SITE_OTHER): Payer: BLUE CROSS/BLUE SHIELD | Admitting: Obstetrics & Gynecology

## 2017-01-27 VITALS — BP 109/77 | HR 93 | Ht 66.0 in | Wt 146.0 lb

## 2017-01-27 DIAGNOSIS — Z3202 Encounter for pregnancy test, result negative: Secondary | ICD-10-CM | POA: Diagnosis not present

## 2017-01-27 DIAGNOSIS — R87613 High grade squamous intraepithelial lesion on cytologic smear of cervix (HGSIL): Secondary | ICD-10-CM

## 2017-01-27 DIAGNOSIS — Z87898 Personal history of other specified conditions: Secondary | ICD-10-CM

## 2017-01-27 DIAGNOSIS — N871 Moderate cervical dysplasia: Secondary | ICD-10-CM | POA: Diagnosis not present

## 2017-01-27 DIAGNOSIS — Z8742 Personal history of other diseases of the female genital tract: Secondary | ICD-10-CM

## 2017-01-27 DIAGNOSIS — N870 Mild cervical dysplasia: Secondary | ICD-10-CM | POA: Diagnosis not present

## 2017-01-27 LAB — POCT URINE PREGNANCY: PREG TEST UR: NEGATIVE

## 2017-01-27 NOTE — Progress Notes (Signed)
LEEP PROCEDURE NOTE Pap smear and colposcopy reviewed.   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  Role of HPV,cervical dysplasia and need for close followup was empasized. Informed written consent was obtained. All questions were answered. Time out performed.  Procedure: The patient was placed in lithotomy position and the bivalved coated speculum was placed in the patient's vagina. A grounding pad placed on the patient. Acetic acid solution was applied to the cervix and areas of AWE were noted.   Local anesthesia was administered via an intracervical block using 10cc of 2% Lidocaine with epinephrine. The suction was turned on and the Large shallow Fisher Cone Biopsy Excisor on 80 Watts of cutting current was used to excise the area of decreased uptake and excise the entire transformation zone. Excellent hemostasis was achieved using roller ball coagulation set at 50 Watts coagulation current. Monsel's solution was then applied and the speculum was removed from the vagina. Specimens were sent to pathology. The patient tolerated the procedure well. Post-operative instructions given to patient, including instruction to seek medical attention for persistent bright red bleeding, fever, abdominal/pelvic pain, dysuria, nausea or vomiting. She was also told about the possibility of having copious yellow to black tinged discharge. She was counseled to avoid anything in the vagina (sex/douching/tampons) for 4 weeks. She has a  2 week post-operative check to review results and assess wound healing.

## 2017-02-03 ENCOUNTER — Telehealth: Payer: Self-pay | Admitting: *Deleted

## 2017-02-03 NOTE — Telephone Encounter (Signed)
Pt notified of biopsy results and will repeat her pap in 1 year.  Pt will return for post op visit in about 4 weeks.

## 2017-03-05 ENCOUNTER — Ambulatory Visit (INDEPENDENT_AMBULATORY_CARE_PROVIDER_SITE_OTHER): Payer: BLUE CROSS/BLUE SHIELD

## 2017-03-05 ENCOUNTER — Ambulatory Visit (INDEPENDENT_AMBULATORY_CARE_PROVIDER_SITE_OTHER): Payer: BLUE CROSS/BLUE SHIELD | Admitting: Obstetrics & Gynecology

## 2017-03-05 ENCOUNTER — Encounter: Payer: Self-pay | Admitting: Obstetrics & Gynecology

## 2017-03-05 VITALS — BP 94/65 | HR 67 | Ht 65.0 in | Wt 149.0 lb

## 2017-03-05 DIAGNOSIS — T839XXA Unspecified complication of genitourinary prosthetic device, implant and graft, initial encounter: Secondary | ICD-10-CM

## 2017-03-05 DIAGNOSIS — Z30431 Encounter for routine checking of intrauterine contraceptive device: Secondary | ICD-10-CM

## 2017-03-05 DIAGNOSIS — Z3202 Encounter for pregnancy test, result negative: Secondary | ICD-10-CM

## 2017-03-05 DIAGNOSIS — R635 Abnormal weight gain: Secondary | ICD-10-CM

## 2017-03-05 DIAGNOSIS — N939 Abnormal uterine and vaginal bleeding, unspecified: Secondary | ICD-10-CM | POA: Diagnosis not present

## 2017-03-05 LAB — PROLACTIN: PROLACTIN: 5.8 ng/mL

## 2017-03-05 LAB — POCT URINE PREGNANCY: Preg Test, Ur: NEGATIVE

## 2017-03-05 LAB — TSH: TSH: 0.95 m[IU]/L

## 2017-03-06 ENCOUNTER — Encounter: Payer: Self-pay | Admitting: Obstetrics & Gynecology

## 2017-03-06 DIAGNOSIS — T8332XA Displacement of intrauterine contraceptive device, initial encounter: Secondary | ICD-10-CM | POA: Insufficient documentation

## 2017-03-06 NOTE — Progress Notes (Signed)
   Subjective:    Patient ID: Jodi Coleman, female    DOB: [birth year], 36 y.o.   MRN: [id number]  HPI  Pt here s/p LEEP.  Had bleeding for 10-14 days post LEEP.  Stopped for 2 weeks, now bleeding again.  Wonders if it could be her menses.  Had new IUD placed in January.  No discharge or pain.  No treatments thus far.  Has had intercourse once.  Pt also c/o 20 pound weight gain.  Review of Systems  Constitutional: Negative.   Cardiovascular: Negative.   Gastrointestinal: Negative.   Genitourinary: Positive for pelvic pain and vaginal bleeding. Negative for dyspareunia, dysuria, vaginal discharge and vaginal pain.  Psychiatric/Behavioral: Negative.        Objective:   Physical Exam  Constitutional: She is oriented to person, place, and time. She appears well-developed and well-nourished. No distress.  HENT:  Head: Normocephalic and atraumatic.  Eyes: Conjunctivae are normal.  Pulmonary/Chest: Effort normal.  Abdominal: Soft. She exhibits no distension.  Genitourinary:  Genitourinary Comments: Vagina--no lesion; scant blood in vault Crvix--LEEP site healed well.  One "dot' of bleeding and silver nitrate applied Uterus--retroverted, iud strings not felt or seen (cut during LEEP) Adnexa--no masses.  Musculoskeletal: She exhibits no edema.  Neurological: She is alert and oriented to person, place, and time.  Skin: Skin is warm and dry.  Psychiatric: She has a normal mood and affect.  Vitals reviewed.  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 5-TSH

## 2017-04-03 ENCOUNTER — Other Ambulatory Visit: Payer: Self-pay | Admitting: Family Medicine

## 2017-04-18 DIAGNOSIS — H52223 Regular astigmatism, bilateral: Secondary | ICD-10-CM | POA: Diagnosis not present

## 2017-05-15 ENCOUNTER — Encounter: Payer: Self-pay | Admitting: Obstetrics & Gynecology

## 2017-05-15 ENCOUNTER — Ambulatory Visit (INDEPENDENT_AMBULATORY_CARE_PROVIDER_SITE_OTHER): Payer: BLUE CROSS/BLUE SHIELD | Admitting: Obstetrics & Gynecology

## 2017-05-15 VITALS — BP 107/75 | HR 78 | Resp 16 | Ht 66.0 in | Wt 146.0 lb

## 2017-05-15 DIAGNOSIS — Z3202 Encounter for pregnancy test, result negative: Secondary | ICD-10-CM

## 2017-05-15 DIAGNOSIS — T8332XS Displacement of intrauterine contraceptive device, sequela: Secondary | ICD-10-CM

## 2017-05-15 DIAGNOSIS — Z30433 Encounter for removal and reinsertion of intrauterine contraceptive device: Secondary | ICD-10-CM

## 2017-05-15 LAB — POCT URINE PREGNANCY: PREG TEST UR: NEGATIVE

## 2017-05-15 MED ORDER — LEVONORGESTREL 20 MCG/24HR IU IUD
INTRAUTERINE_SYSTEM | Freq: Once | INTRAUTERINE | Status: AC
  Administered 2017-05-15: 17:00:00 via INTRAUTERINE

## 2017-05-15 NOTE — Progress Notes (Signed)
   Subjective:    Patient ID: Donalee Citrin, female    DOB: [birth year], 36 y.o.   MRN: [id number]  HPI  Pts IUD Is low in the lower uterine segment. Patient had an full periods. Patient has been using condoms to make sure that the IUD is not malfunctioning. They would like to have the IUD removed and a new one inserted. Patient noted to have retroverted uterus and we will make sure that ultrasound that the IUD reaches the fundus.  Review of Systems  Constitutional: Negative.   Respiratory: Negative.   Cardiovascular: Negative.   Gastrointestinal: Negative.   Genitourinary: Positive for vaginal bleeding. Negative for pelvic pain.  Musculoskeletal: Negative.   Psychiatric/Behavioral: Negative.        Objective:   Physical Exam  Constitutional: She is oriented to person, place, and time. She appears well-developed and well-nourished. No distress.  HENT:  Head: Normocephalic and atraumatic.  Eyes: Conjunctivae are normal.  Pulmonary/Chest: Effort normal.  Abdominal: Soft. She exhibits no distension. There is no tenderness.  Genitourinary:  Genitourinary Comments: retroverted  Musculoskeletal: She exhibits no edema.  Neurological: She is alert and oriented to person, place, and time.  Skin: Skin is warm and dry.  Psychiatric: She has a normal mood and affect.  Vitals reviewed.  Vitals:   05/15/17 1524  BP: 107/75  Pulse: 78  Resp: 16  Weight: 146 lb (66.2 kg)  Height: 5\' 6"  (1.676 m)       Assessment & Plan:  Discussed options of birth control. Patient opts for IUD removal and reinsertion. UPT negative today  GYNECOLOGY CLINIC PROCEDURE NOTE  CHARLISA CHAM is a 36 y.o. G1P1001 here for Mirena IUD removal and reinsertion. No GYN concerns.    IUD Removal and Reinsertion  Patient identified, informed consent performed. Discussed risks of irregular bleeding, cramping, infection, malpositioning or misplacement of the IUD outside the uterus which may require further  procedures. Time out was performed. Speculum placed in the vagina. Cervix visualized. Cleaned with Betadine x 2. Grasped anteriorly with a single tooth tenaculum. The strings were not visible but easily grasped in the cervix. The IUD was successfully removed in its entirety. Uterus sounded to 8 cm. Mirena IUD placed per manufacturer's recommendations. Strings trimmed to 3 cm. Tenaculum was removed, good hemostasis noted. Patient tolerated procedure well. Patient was given post-procedure instructions.  Patient was also asked to check IUD strings periodically and follow up in 4 weeks for IUD check.  Bedside ultrasound showed the IUD at the fundus.  RTC 6 weeks for string check.

## 2017-07-02 ENCOUNTER — Other Ambulatory Visit: Payer: Self-pay | Admitting: Family Medicine

## 2017-07-25 ENCOUNTER — Ambulatory Visit (INDEPENDENT_AMBULATORY_CARE_PROVIDER_SITE_OTHER): Payer: BLUE CROSS/BLUE SHIELD | Admitting: Family Medicine

## 2017-07-25 ENCOUNTER — Encounter: Payer: Self-pay | Admitting: Family Medicine

## 2017-07-25 VITALS — BP 108/77 | HR 70 | Wt 147.0 lb

## 2017-07-25 DIAGNOSIS — F411 Generalized anxiety disorder: Secondary | ICD-10-CM

## 2017-07-25 DIAGNOSIS — F332 Major depressive disorder, recurrent severe without psychotic features: Secondary | ICD-10-CM

## 2017-07-25 MED ORDER — CITALOPRAM HYDROBROMIDE 10 MG PO TABS: 5.0000 mg | ORAL_TABLET | Freq: Every day | ORAL | 0 refills | Status: DC

## 2017-07-25 NOTE — Patient Instructions (Addendum)
Thank you for coming in today. To STOP celexa.   Take 1/2 pill daily for 2 weeks and then stop.  If you feel odd take 1/2 of the 10mg  pills (5mg  total) daily for 2 weeks then stop.   Recheck with me as needed.   I do recommend a Tdap.   TENS UNIT: This is helpful for muscle pain and spasm.   Search and Purchase a TENS 7000 2nd edition at  www.tenspros.com or www.[employer].com It should be less than $30.     TENS unit instructions: Do not shower or bathe with the unit on Turn the unit off before removing electrodes or batteries If the electrodes lose stickiness add a drop of water to the electrodes after they are disconnected from the unit and place on plastic sheet. If you continued to have difficulty, call the TENS unit company to purchase more electrodes. Do not apply lotion on the skin area prior to use. Make sure the skin is clean and dry as this will help prolong the life of the electrodes. After use, always check skin for unusual red areas, rash or other skin difficulties. If there are any skin problems, does not apply electrodes to the same area. Never remove the electrodes from the unit by pulling the wires. Do not use the TENS unit or electrodes other than as directed. Do not change electrode placement without consultating your therapist or physician. Keep 2 fingers with between each electrode. Wear time ratio is 2:1, on to off times.    For example on for 30 minutes off for 15 minutes and then on for 30 minutes off for 15 minutes

## 2017-07-25 NOTE — Progress Notes (Signed)
Jodi Coleman is a 36 y.o. female who presents to [place]: [place] today for follow-up anxiety and depression. Patient has been seen previously for significant anxiety and depression. She's feeling back to normal now. She would like to taper off of Celexa if possible. Additionally she notes her insomnia has resolved and she no longer is using trazodone.  Additionally she notes a few weeks ago she experienced an episode of back strain that has since resolved. She is wondering what she can do next time if it happens.   Past Medical History:  Diagnosis Date  . Abnormal Pap smear of cervix   . HPV (human papilloma virus) infection    Past Surgical History:  Procedure Laterality Date  . REFRACTIVE SURGERY    . TONSILLECTOMY AND ADENOIDECTOMY     Social History  Substance Use Topics  . Smoking status: Former Research scientist (life sciences)  . Smokeless tobacco: Never Used     Comment: QUIT IN 2005  . Alcohol use 0.0 oz/week     Comment: wine occassionally   family history includes Alcohol abuse in her father; Cancer in her maternal aunt and maternal grandfather; Depression in her father and mother; Diabetes in her maternal grandmother; Heart attack in her paternal grandmother; Hypertension in her father; Stroke in her paternal grandmother.  ROS as above:  Medications: Current Outpatient Prescriptions  Medication Sig Dispense Refill  . citalopram (CELEXA) 10 MG tablet Take 0.5 tablets (5 mg total) by mouth daily. Take 1/2 pill for 2-4 weeks as part of taper. 15 tablet 0   No current facility-administered medications for this visit.    Allergies  Allergen Reactions  . Zoloft [Sertraline Hcl] Rash    Maybe due to Zoloft    Health Maintenance Health Maintenance  Topic Date Due  . HIV Screening  08/10/1996  . TETANUS/TDAP  08/10/2000  . INFLUENZA VACCINE  07/16/2017  . PAP SMEAR   12/07/2019     Exam:  BP 108/77   Pulse 70   Wt 147 lb (66.7 kg)   BMI 23.73 kg/m  Gen: Well NAD  Psych alert and oriented normal speech thought process and affect.  Depression screen PHQ 2/9 07/03/2016  Decreased Interest 1  Down, Depressed, Hopeless 1  PHQ - 2 Score 2  Altered sleeping 2  Tired, decreased energy 2  Change in appetite 1  Feeling bad or failure about yourself  1  Trouble concentrating 2  Moving slowly or fidgety/restless 1  Suicidal thoughts 0  PHQ-9 Score 11  Difficult doing work/chores Somewhat difficult     No results found for this or any previous visit (from the past 72 hour(s)). No results found.    Assessment and Plan: 36 y.o. female with Anxiety and depression improving to resolved. Plan to taper off of Celexa. We'll start taking 10 mg daily now (one half pill). After a few weeks will switch to 5 mg and stop.  For back strain plan to use heating pad and TENS unit and potential referral to physical therapy if it happens again. Recommend working on core strengthening now.   No orders of the defined types were placed in this encounter.  Meds ordered this encounter  Medications  . citalopram (CELEXA) 10 MG tablet    Sig: Take 0.5 tablets (5 mg total) by mouth daily. Take 1/2 pill for 2-4 weeks as part of taper.    Dispense:  15 tablet    Refill:  0     Discussed warning signs or symptoms. Please see discharge instructions. Patient expresses understanding.

## 2017-07-31 ENCOUNTER — Other Ambulatory Visit: Payer: Self-pay | Admitting: Family Medicine

## 2018-03-11 ENCOUNTER — Encounter: Payer: Self-pay | Admitting: Obstetrics & Gynecology

## 2018-03-11 ENCOUNTER — Ambulatory Visit (INDEPENDENT_AMBULATORY_CARE_PROVIDER_SITE_OTHER): Payer: BLUE CROSS/BLUE SHIELD | Admitting: Obstetrics & Gynecology

## 2018-03-11 ENCOUNTER — Encounter: Payer: Self-pay | Admitting: Family Medicine

## 2018-03-11 ENCOUNTER — Ambulatory Visit (INDEPENDENT_AMBULATORY_CARE_PROVIDER_SITE_OTHER): Payer: BLUE CROSS/BLUE SHIELD | Admitting: Family Medicine

## 2018-03-11 VITALS — BP 116/78 | HR 68 | Ht 65.0 in | Wt 143.0 lb

## 2018-03-11 VITALS — BP 125/80 | HR 81 | Ht 65.0 in | Wt 144.0 lb

## 2018-03-11 DIAGNOSIS — Z124 Encounter for screening for malignant neoplasm of cervix: Secondary | ICD-10-CM

## 2018-03-11 DIAGNOSIS — F331 Major depressive disorder, recurrent, moderate: Secondary | ICD-10-CM

## 2018-03-11 DIAGNOSIS — I498 Other specified cardiac arrhythmias: Secondary | ICD-10-CM

## 2018-03-11 DIAGNOSIS — Z1151 Encounter for screening for human papillomavirus (HPV): Secondary | ICD-10-CM

## 2018-03-11 DIAGNOSIS — F411 Generalized anxiety disorder: Secondary | ICD-10-CM

## 2018-03-11 DIAGNOSIS — R002 Palpitations: Secondary | ICD-10-CM | POA: Insufficient documentation

## 2018-03-11 DIAGNOSIS — Z23 Encounter for immunization: Secondary | ICD-10-CM | POA: Diagnosis not present

## 2018-03-11 DIAGNOSIS — F321 Major depressive disorder, single episode, moderate: Secondary | ICD-10-CM | POA: Diagnosis not present

## 2018-03-11 DIAGNOSIS — Z01419 Encounter for gynecological examination (general) (routine) without abnormal findings: Secondary | ICD-10-CM | POA: Diagnosis not present

## 2018-03-11 LAB — CBC
HCT: 43.2 % (ref 35.0–45.0)
HEMOGLOBIN: 14.9 g/dL (ref 11.7–15.5)
MCH: 32 pg (ref 27.0–33.0)
MCHC: 34.5 g/dL (ref 32.0–36.0)
MCV: 92.9 fL (ref 80.0–100.0)
MPV: 10 fL (ref 7.5–12.5)
PLATELETS: 237 10*3/uL (ref 140–400)
RBC: 4.65 10*6/uL (ref 3.80–5.10)
RDW: 12 % (ref 11.0–15.0)
WBC: 7.4 10*3/uL (ref 3.8–10.8)

## 2018-03-11 LAB — COMPLETE METABOLIC PANEL WITH GFR
AG RATIO: 1.8 (calc) (ref 1.0–2.5)
ALBUMIN MSPROF: 5 g/dL (ref 3.6–5.1)
ALKALINE PHOSPHATASE (APISO): 51 U/L (ref 33–115)
ALT: 14 U/L (ref 6–29)
AST: 18 U/L (ref 10–30)
BILIRUBIN TOTAL: 0.6 mg/dL (ref 0.2–1.2)
BUN: 10 mg/dL (ref 7–25)
CHLORIDE: 105 mmol/L (ref 98–110)
CO2: 28 mmol/L (ref 20–32)
Calcium: 9.7 mg/dL (ref 8.6–10.2)
Creat: 0.72 mg/dL (ref 0.50–1.10)
GFR, EST AFRICAN AMERICAN: 125 mL/min/{1.73_m2} (ref >=60)
GFR, Est Non African American: 108 mL/min/{1.73_m2} (ref >=60)
Globulin: 2.8 g/dL (calc) (ref 1.9–3.7)
Glucose, Bld: 90 mg/dL (ref 65–99)
POTASSIUM: 3.9 mmol/L (ref 3.5–5.3)
Sodium: 139 mmol/L (ref 135–146)
TOTAL PROTEIN: 7.8 g/dL (ref 6.1–8.1)

## 2018-03-11 LAB — TSH: TSH: 1.15 m[IU]/L

## 2018-03-11 NOTE — Addendum Note (Signed)
Addended by: Lyndal Rainbow on: 03/11/2018 09:52 AM   Modules accepted: Orders

## 2018-03-11 NOTE — Progress Notes (Signed)
Jodi Coleman is a 37 y.o. female who presents to [place]: [place] today for palpitations. Jodi Coleman notes a several week history of tachypalpitations occurring at work.  She notes this occurs with anxiety.  She notes work is been more stressful recently worsening her underlying anxiety issues.  She denies chest pain shortness of breath lightheadedness or dizziness.  Her Fitbit notes that her heart rate goes up as high as 110 or 120 at work at times without exercise.  He is able to exert herself without any adverse symptoms.  The past she was successfully treated with Celexa for depression.  She notes that she had obnoxious side effects when she tapered and discontinued Celexa in the past.  She is done counseling as well in the past for depression and notes that it helped only a little.   Past Medical History:  Diagnosis Date  . Abnormal Pap smear of cervix   . HPV (human papilloma virus) infection    Past Surgical History:  Procedure Laterality Date  . LEEP    . REFRACTIVE SURGERY    . TONSILLECTOMY AND ADENOIDECTOMY     Social History   Tobacco Use  . Smoking status: Former Research scientist (life sciences)  . Smokeless tobacco: Never Used  . Tobacco comment: QUIT IN 2005  Substance Use Topics  . Alcohol use: Yes    Alcohol/week: 0.0 oz    Comment: wine occassionally   family history includes Alcohol abuse in her father; Cancer in her maternal aunt and maternal grandfather; Depression in her father and mother; Diabetes in her maternal grandmother; Heart attack in her paternal grandmother; Hypertension in her father; Stroke in her paternal grandmother.  ROS as above:  Medications: No current outpatient medications on file.   No current facility-administered medications for this visit.    Allergies  Allergen Reactions  . Zoloft [Sertraline Hcl] Rash    Maybe due to [facility]  Maintenance Health Maintenance  Topic Date Due  . Samul Dada  08/10/2000  . PAP SMEAR  12/07/2019  . INFLUENZA VACCINE  Completed  . HIV Screening  Completed     Exam:  BP 125/80   Pulse 81   Ht 5\' 5"  (1.651 m)   Wt 144 lb (65.3 kg)   BMI 23.96 kg/m  Gen: Well NAD HEENT: EOMI,  MMM Lungs: Normal work of breathing. CTABL Heart: RRR no MRG Abd: NABS, Soft. Nondistended, Nontender Exts: Brisk capillary refill, warm and well perfused.  Psych: Alert and oriented.  Slightly anxious.  Affect.  No SI or HI expressed.  Depression screen [facility] 2/9 03/11/2018 07/03/2016  Decreased Interest 1 1  Down, Depressed, Hopeless 1 1  PHQ - 2 Score 2 2  Altered sleeping 2 2  Tired, decreased energy 2 2  Change in appetite 2 1  Feeling bad or failure about yourself  1 1  Trouble concentrating 1 2  Moving slowly or fidgety/restless 1 1  Suicidal thoughts 0 0  PHQ-9 Score 11 11  Difficult doing work/chores Somewhat difficult Somewhat difficult   GAD 7 : Generalized Anxiety Score 03/11/2018 07/03/2016  Nervous, Anxious, on Edge 2 1  Control/stop worrying 3 1  Worry too much - different things 3 1  Trouble relaxing 2 1  Restless 1 0  Easily annoyed or irritable 2 2  Afraid - awful might happen 0 1  Total GAD 7 Score 13 7  Anxiety Difficulty Somewhat difficult Somewhat difficult  Twelve-lead EKG shows normal sinus rhythm at 75 bpm.  No ST segment elevation or depression.  Normal intervals.  Normal EKG.      Assessment and Plan: 37 y.o. female with  Tachypalpitations.  EKG normal today and exam is normal today.  I suspect her symptoms are predominantly due to anxiety but she deserves a bit of a workup.  Plan for CBC metabolic panel TSH and Holter monitor.  Regardless of her tachypalpitations she does have worsening anxiety and depression symptoms.  He is a lot of stress at work and I think that is the fundamental underlying problem.  We discussed options including watchful waiting,  new job, more counseling, or medications.  She is thinking about her options but I suspect the most likely next step is going to be starting citalopram which is reasonable.  Patient will send me an update in a few weeks with how she is doing.  Return to clinic at any time.  Tdap given  Orders Placed This Encounter  Procedures  . Tdap vaccine greater than or equal to 7yo IM  . CBC  . COMPLETE METABOLIC PANEL WITH GFR  . TSH  . Holter monitor - 48 hour    Standing Status:   Future    Standing Expiration Date:   03/11/2028  . EKG 12-Lead   No orders of the defined types were placed in this encounter.    Discussed warning signs or symptoms. Please see discharge instructions. Patient expresses understanding.

## 2018-03-11 NOTE — Progress Notes (Signed)
Subjective:     Jodi Coleman is a 37 y.o. female here for a routine exam.  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  Pt saw Dr. Georgina Snell for this.  She stopped Celexa.     Gynecologic History No LMP recorded. (Menstrual status: IUD). Contraception: IUD Last Pap: 2018. 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  This is first pap smear since LEP Last mammogram: n/a.   Obstetric History OB History  Gravida Para Term Preterm AB Living  1 1 1     1   SAB TAB Ectopic Multiple Live Births               # Outcome Date GA Lbr Len/2nd Weight Sex Delivery Anes PTL Lv  1 Term 08/25/06 [redacted]w[redacted]d   M Vag-Spont        The following portions of the patient's history were reviewed and updated as appropriate: allergies, current medications, past family history, past medical history, past social history, past surgical history and problem list.  Review of Systems Pertinent items noted in HPI and remainder of comprehensive ROS otherwise negative.    Objective:      Vitals:   03/11/18 0856  BP: 116/78  Pulse: 68  Weight: 143 lb (64.9 kg)  Height: 5\' 5"  (1.651 m)   Vitals:  WNL General appearance: alert, cooperative and no distress  HEENT: Normocephalic, without obvious abnormality, atraumatic Eyes: negative Throat: lips, mucosa, and tongue normal; teeth and gums normal  Respiratory: Clear to auscultation bilaterally  CV: Regular rate and rhythm  Breasts:  Normal appearance, no masses or tenderness, no nipple retraction or dimpling  GI: Soft, non-tender; bowel sounds normal; no masses,  no organomegaly  GU: External Genitalia:  Tanner V, no lesion Urethra:  No prolapse   Vagina: Pink, normal rugae, no blood or discharge  Cervix: No CMT, no lesion, IUD strings seen and nml length  Uterus:  Normal size and contour, non tender  Adnexa: Normal, no masses, non tender  Musculoskeletal: No edema, redness or tenderness in the calves or thighs  Skin: No  lesions or rash  Lymphatic: Axillary adenopathy: none     Psychiatric: Normal mood and behavior    Assessment:    Healthy female exam.   Anxiety and job stress Intermittent tachycardia   Plan:    Pap with cotesting F/U with Dr. Georgina Snell regarding anxiety and tachycardia (normal rate and rhythm today) Continue IUD.

## 2018-03-11 NOTE — Patient Instructions (Signed)
Thank you for coming in today. You should hear about the heart test (holter monitor) If you do not hear anything let me know.  Get labs now.   For anxiety think about job change or medicine or therapy.  The next medicine up may be a retrial of Celexa or possibly Prozac.   Let me know how you are doing in about 4 weeks assuming every comes back normal.    Palpitations A palpitation is the feeling that your heartbeat is irregular or is faster than normal. It may feel like your heart is fluttering or skipping a beat. Palpitations are usually not a serious problem. They may be caused by many things, including smoking, caffeine, alcohol, stress, and certain medicines. Although most causes of palpitations are not serious, palpitations can be a sign of a serious medical problem. In some cases, you may need further medical evaluation. Follow these instructions at home: Pay attention to any changes in your symptoms. Take these actions to help with your condition:  Avoid the following: ? Caffeinated coffee, tea, soft drinks, diet pills, and energy drinks. ? Chocolate. ? Alcohol.  Do not use any tobacco products, such as cigarettes, chewing tobacco, and e-cigarettes. If you need help quitting, ask your health care provider.  Try to reduce your stress and anxiety. Things that can help you relax include: ? Yoga. ? Meditation. ? Physical activity, such as swimming, jogging, or walking. ? Biofeedback. This is a method that helps you learn to use your mind to control things in your body, such as your heartbeats.  Get plenty of rest and sleep.  Take over-the-counter and prescription medicines only as told by your health care provider.  Keep all follow-up visits as told by your health care provider. This is important.  Contact a health care provider if:  You continue to have a fast or irregular heartbeat after 24 hours.  Your palpitations occur more often. Get help right away if:  You have  chest pain or shortness of breath.  You have a severe headache.  You feel dizzy or you faint. This information is not intended to replace advice given to you by your health care provider. Make sure you discuss any questions you have with your health care provider. Document Released: 11/29/2000 Document Revised: 05/06/2016 Document Reviewed: 08/17/2015 Elsevier Interactive Patient Education  [employer].

## 2018-03-13 LAB — CYTOLOGY - PAP
Diagnosis: NEGATIVE
HPV (WINDOPATH): NOT DETECTED

## 2018-03-16 ENCOUNTER — Encounter: Payer: Self-pay | Admitting: Family Medicine

## 2018-03-30 ENCOUNTER — Other Ambulatory Visit: Payer: Self-pay | Admitting: Family Medicine

## 2018-03-30 ENCOUNTER — Ambulatory Visit (INDEPENDENT_AMBULATORY_CARE_PROVIDER_SITE_OTHER): Payer: BLUE CROSS/BLUE SHIELD

## 2018-03-30 DIAGNOSIS — I498 Other specified cardiac arrhythmias: Secondary | ICD-10-CM

## 2018-03-30 DIAGNOSIS — R002 Palpitations: Secondary | ICD-10-CM | POA: Diagnosis not present

## 2018-04-06 ENCOUNTER — Encounter: Payer: Self-pay | Admitting: Family Medicine

## 2018-04-10 ENCOUNTER — Other Ambulatory Visit: Payer: Self-pay

## 2018-04-10 ENCOUNTER — Encounter: Payer: Self-pay | Admitting: Family Medicine

## 2018-04-10 ENCOUNTER — Ambulatory Visit (INDEPENDENT_AMBULATORY_CARE_PROVIDER_SITE_OTHER): Payer: BLUE CROSS/BLUE SHIELD | Admitting: Family Medicine

## 2018-04-10 VITALS — BP 97/63 | HR 63 | Temp 98.6°F | Resp 16 | Wt 139.0 lb

## 2018-04-10 DIAGNOSIS — R002 Palpitations: Secondary | ICD-10-CM | POA: Diagnosis not present

## 2018-04-10 NOTE — Progress Notes (Signed)
Holter monitor currently being corrected after conversation with [facility] cardiology.

## 2018-04-10 NOTE — Progress Notes (Signed)
       Jodi Coleman is a 37 y.o. female who presents to [place]: [place] today for follow-up Holter monitor.  Patient had a Holter monitor since the last visit and is here for follow-up.  The preliminary Holter report shows normal sinus rhythm with some sinus arrhythmia but no significant ectopy.  She in the interval feels pretty well with no chest pain.  She is able to exert herself fully with no lightheadedness or dizziness.  Past Medical History:  Diagnosis Date  . Abnormal Pap smear of cervix   . HPV (human papilloma virus) infection    Past Surgical History:  Procedure Laterality Date  . LEEP    . REFRACTIVE SURGERY    . TONSILLECTOMY AND ADENOIDECTOMY     Social History   Tobacco Use  . Smoking status: Former Research scientist (life sciences)  . Smokeless tobacco: Never Used  . Tobacco comment: QUIT IN 2005  Substance Use Topics  . Alcohol use: Yes    Alcohol/week: 0.0 oz    Comment: wine occassionally   family history includes Alcohol abuse in her father; Cancer in her maternal aunt and maternal grandfather; Depression in her father and mother; Diabetes in her maternal grandmother; Heart attack in her paternal grandmother; Hypertension in her father; Stroke in her paternal grandmother.  ROS as above:  Medications: No current outpatient medications on file.   No current facility-administered medications for this visit.    Allergies  Allergen Reactions  . Zoloft [Sertraline Hcl] Rash    Maybe due to Zoloft    Health Maintenance Health Maintenance  Topic Date Due  . INFLUENZA VACCINE  07/16/2018  . PAP SMEAR  03/11/2021  . TETANUS/TDAP  03/11/2028  . HIV Screening  Completed     Exam:  BP 97/63 (BP Location: Left Arm, Patient Position: Sitting, Cuff Size: Normal)   Pulse 63   Temp 98.6 F (37 C)   Resp 16   Wt 139 lb (63 kg)   SpO2 100%   BMI 23.13 kg/m  Gen: Well  NAD HEENT: EOMI,  MMM Lungs: Normal work of breathing. CTABL Heart: RRR no MRG Abd: NABS, Soft. Nondistended, Nontender Exts: Brisk capillary refill, warm and well perfused.     Assessment and Plan: 37 y.o. female with palpitations: Reassuring Holter monitor.  The full report is still pending at this time.  Will contact patient again with the formal report.  Plan for watchful waiting.   Discussed warning signs or symptoms. Please see discharge instructions. Patient expresses understanding.  Of note initial Holter monitor report was incorrect.  This is currently being rectified in the system.  Patient aware of change with phone call.

## 2018-04-10 NOTE — Patient Instructions (Signed)
Thank you for coming in today. Recheck as needed.,  We will keep an eye on the symptoms.  If worsening next step is cardiology and metoprolol.  Call or go to the emergency room if you get worse, have trouble breathing, have chest pains, or palpitations.    Premature Ventricular Contraction A premature ventricular contraction (PVC) is a common irregularity in the normal heart rhythm. These contractions are extra heartbeats that start in the heart ventricles and occur too early in the normal sequence. During the PVC, the heart's normal electrical pathway is not used, so the beat is shorter and less effective. In most cases, these contractions come and go and do not require treatment. What are the causes? In many cases, the cause may not be known. Common causes of the condition include:  Smoking.  Drinking alcohol.  Caffeine.  Certain medicines.  Some illegal drugs.  Stress.  Certain medical conditions can also cause PVCs:  Changes in minerals in the blood (electrolytes).  Heart failure.  Heart valve problems.  Low blood oxygen levels or high carbon dioxide levels.  Heart attack, or coronary artery disease.  What are the signs or symptoms? The main symptom of this condition is a fast or skipped heartbeat (palpitations). Other symptoms include:  Chest pain.  Shortness of breath.  Feeling tired.  Dizziness.  In some cases, there are no symptoms. How is this diagnosed? This condition may be diagnosed based on:  Your medical history.  A physical exam. During the exam, the health care provider will check for irregular heartbeats.  Tests, such as: ? An ECG (electrocardiogram) to monitor the electrical activity of your heart. ? Holter monitor testing. This involves wearing a device that clips to your clothing and monitors the electrical activity of your heart over longer periods of time. ? Stress tests to see how exercise affects your heart rhythm and blood  supply. ? Echocardiogram. This test uses sound waves (ultrasound) to produce an image of your heart. ? Electrophysiology study. This test checks the electric pathways in your heart.  How is this treated? Treatment depends on any underlying conditions, the type of PVCs that you are having, and how much the symptoms are interfering with your daily life. Possible treatments include:  Avoiding things that can trigger the premature contractions, such as caffeine or alcohol.  Medicines. These may be given if symptoms are severe or if the extra heartbeats are frequent.  Treatment for any underlying condition that is found to be the cause of the contractions.  Catheter ablation. This procedure destroys the heart tissues that send abnormal signals.  In some cases, no treatment is required. Follow these instructions at home: Lifestyle Follow these instructions as told by your health care provider:  Do not use any products that contain nicotine or tobacco, such as cigarettes and e-cigarettes. If you need help quitting, ask your health care provider.  If caffeine triggers episodes of PVC, do not eat, drink, or use anything with caffeine in it.  If caffeine does not seem to trigger episodes, consume caffeine in moderation.  If alcohol triggers episodes of PVC, do not drink alcohol.  If alcohol does not seem to trigger episodes, limit alcohol intake to no more than 1 drink a day for nonpregnant women and 2 drinks a day for men. One drink equals 12 oz of beer, 5 oz of wine, or 1 oz of hard liquor.  Exercise regularly. Ask your health care provider what type of exercise is safe for you.  Find healthy ways to manage stress. Avoid stressful situations when possible.  Try to get at least 7-9 hours of sleep each night, or as much as recommended by your health care provider.  Do not use illegal drugs.  General instructions  Take over-the-counter and prescription medicines only as told by your  health care provider.  Keep all follow-up visits as told by your health care provider. This is important. Get help right away if:  You feel palpitations that are frequent or continual.  You have chest pain.  You have shortness of breath.  You have sweating for no reason.  You have nausea and vomiting.  You become light-headed or you faint. This information is not intended to replace advice given to you by your health care provider. Make sure you discuss any questions you have with your health care provider. Document Released: 07/19/2004 Document Revised: 07/26/2016 Document Reviewed: 05/08/2016 Elsevier Interactive Patient Education  [employer].

## 2018-04-14 ENCOUNTER — Telehealth: Payer: Self-pay | Admitting: Family Medicine

## 2018-04-14 NOTE — Telephone Encounter (Signed)
I called Jodi Coleman and discussed Holter Monitor Report.

## 2018-05-07 ENCOUNTER — Encounter: Payer: Self-pay | Admitting: Family Medicine

## 2018-05-25 DIAGNOSIS — Z713 Dietary counseling and surveillance: Secondary | ICD-10-CM | POA: Diagnosis not present

## 2018-06-12 DIAGNOSIS — H52223 Regular astigmatism, bilateral: Secondary | ICD-10-CM | POA: Diagnosis not present

## 2019-09-20 ENCOUNTER — Ambulatory Visit (INDEPENDENT_AMBULATORY_CARE_PROVIDER_SITE_OTHER): Payer: BC Managed Care – PPO | Admitting: Obstetrics & Gynecology

## 2019-09-20 ENCOUNTER — Encounter: Payer: Self-pay | Admitting: Obstetrics & Gynecology

## 2019-09-20 ENCOUNTER — Other Ambulatory Visit: Payer: Self-pay

## 2019-09-20 VITALS — BP 108/76 | HR 78 | Ht 65.0 in | Wt 139.0 lb

## 2019-09-20 DIAGNOSIS — Z8742 Personal history of other diseases of the female genital tract: Secondary | ICD-10-CM

## 2019-09-20 DIAGNOSIS — Z1151 Encounter for screening for human papillomavirus (HPV): Secondary | ICD-10-CM

## 2019-09-20 DIAGNOSIS — Z975 Presence of (intrauterine) contraceptive device: Secondary | ICD-10-CM

## 2019-09-20 DIAGNOSIS — Z23 Encounter for immunization: Secondary | ICD-10-CM | POA: Diagnosis not present

## 2019-09-20 DIAGNOSIS — Z124 Encounter for screening for malignant neoplasm of cervix: Secondary | ICD-10-CM

## 2019-09-20 DIAGNOSIS — Z01419 Encounter for gynecological examination (general) (routine) without abnormal findings: Secondary | ICD-10-CM

## 2019-09-20 NOTE — Progress Notes (Signed)
Last pap: 03/11/18- negative

## 2019-09-20 NOTE — Progress Notes (Signed)
Subjective:     Jodi Coleman is a 38 y.o. female here for a routine exam.  Current complaints: None.  Doing well with Mirena..   Gynecologic History No LMP recorded. (Menstrual status: IUD). Contraception: IUD Last Pap: 2019. Results were: normal.  History of LEEP 2018 Last mammogram: n/a.   Obstetric History OB History  Gravida Para Term Preterm AB Living  1 1 1     1   SAB TAB Ectopic Multiple Live Births               # Outcome Date GA Lbr Len/2nd Weight Sex Delivery Anes PTL Lv  1 Term 08/25/06 [redacted]w[redacted]d   M Vag-Spont        The following portions of the patient's history were reviewed and updated as appropriate: allergies, current medications, past family history, past medical history, past social history, past surgical history and problem list.  Review of Systems Pertinent items noted in HPI and remainder of comprehensive ROS otherwise negative.    Objective:      Vitals:   09/20/19 0825  BP: 108/76  Pulse: 78  Weight: 139 lb (63 kg)  Height: 5\' 5"  (1.651 m)   Vitals:  WNL General appearance: alert, cooperative and no distress  HEENT: Normocephalic, without obvious abnormality, atraumatic Eyes: negative Throat: lips, mucosa, and tongue normal; teeth and gums normal  Respiratory: Clear to auscultation bilaterally  CV: Regular rate and rhythm  Breasts:  Normal appearance, no masses or tenderness, no nipple retraction or dimpling  GI: Soft, non-tender; bowel sounds normal; no masses,  no organomegaly  GU: External Genitalia:  Tanner V, no lesion Urethra:  No prolapse   Vagina: Pink, normal rugae, no blood or discharge  Cervix: No CMT, no lesion, IUD strings seen  Uterus:  Normal size and contour, non tender  Adnexa: Normal, no masses, non tender  Musculoskeletal: No edema, redness or tenderness in the calves or thighs  Skin: No lesions or rash  Lymphatic: Axillary adenopathy: none     Psychiatric: Normal mood and behavior        Assessment:    Healthy  female exam.   IUD   Plan:   Pap with cotesting; if normal can move to q 3 years Flu shot Continue IUD (placed Dec 2017)

## 2019-09-24 LAB — CYTOLOGY - PAP
Diagnosis: NEGATIVE
High risk HPV: NEGATIVE

## 2019-12-19 ENCOUNTER — Encounter: Payer: Self-pay | Admitting: Family Medicine

## 2019-12-21 ENCOUNTER — Ambulatory Visit (INDEPENDENT_AMBULATORY_CARE_PROVIDER_SITE_OTHER): Payer: BC Managed Care – PPO | Admitting: Sports Medicine

## 2019-12-21 ENCOUNTER — Ambulatory Visit (INDEPENDENT_AMBULATORY_CARE_PROVIDER_SITE_OTHER): Payer: BC Managed Care – PPO

## 2019-12-21 ENCOUNTER — Other Ambulatory Visit: Payer: Self-pay

## 2019-12-21 DIAGNOSIS — G5603 Carpal tunnel syndrome, bilateral upper limbs: Secondary | ICD-10-CM | POA: Diagnosis not present

## 2019-12-21 DIAGNOSIS — G8929 Other chronic pain: Secondary | ICD-10-CM | POA: Diagnosis not present

## 2019-12-21 DIAGNOSIS — M542 Cervicalgia: Secondary | ICD-10-CM

## 2019-12-21 MED ORDER — MELOXICAM 15 MG PO TABS: ORAL_TABLET | ORAL | 3 refills | Status: DC

## 2019-12-21 NOTE — Assessment & Plan Note (Addendum)
Noticing tingling in the hand, right worse than left, typically at night and when holding her cell phone.   No progressive weakness or dropping things. Positive Tinel's and Phalen signs on the right. Nighttime splinting for a month, Hydrodissection if no better.

## 2019-12-21 NOTE — Assessment & Plan Note (Signed)
Present now for some time, she is a Copywriter, advertising. 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 X-rays, she has already set up chiropractic manipulation. Adding meloxicam, if persistent discomfort after a month we will proceed with formal physical therapy.

## 2019-12-21 NOTE — Progress Notes (Signed)
    Procedures performed today:    None.  Independent interpretation of tests performed by another provider:   None.  Impression and Recommendations:    Carpal tunnel syndrome, bilateral right worse than left Noticing tingling in the hand, right worse than left, typically at night and when holding her cell phone.   No progressive weakness or dropping things. Positive Tinel's and Phalen signs on the right. Nighttime splinting for a month, Hydrodissection if no better.  Chronic neck pain Present now for some time, she is a Copywriter, advertising. 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 X-rays, she has already set up chiropractic manipulation. Adding meloxicam, if persistent discomfort after a month we will proceed with formal physical therapy.    ___________________________________________ Gwen Her. Dianah Field, M.D., ABFM., CAQSM. Primary Care and [place] Instructor of [place] [facility]

## 2021-09-19 DIAGNOSIS — L719 Rosacea, unspecified: Secondary | ICD-10-CM | POA: Diagnosis not present

## 2021-09-19 DIAGNOSIS — D1801 Hemangioma of skin and subcutaneous tissue: Secondary | ICD-10-CM | POA: Diagnosis not present

## 2021-09-19 DIAGNOSIS — D225 Melanocytic nevi of trunk: Secondary | ICD-10-CM | POA: Diagnosis not present

## 2022-03-08 ENCOUNTER — Ambulatory Visit (INDEPENDENT_AMBULATORY_CARE_PROVIDER_SITE_OTHER): Payer: BC Managed Care – PPO | Admitting: Obstetrics and Gynecology

## 2022-03-08 ENCOUNTER — Encounter: Payer: Self-pay | Admitting: Obstetrics and Gynecology

## 2022-03-08 ENCOUNTER — Other Ambulatory Visit (HOSPITAL_COMMUNITY)
Admission: RE | Admit: 2022-03-08 | Discharge: 2022-03-08 | Disposition: A | Discharge status: Home / Self Care | Payer: BC Managed Care – PPO | Source: Ambulatory Visit | Attending: Obstetrics and Gynecology | Admitting: Obstetrics and Gynecology

## 2022-03-08 ENCOUNTER — Other Ambulatory Visit: Payer: Self-pay

## 2022-03-08 VITALS — BP 111/74 | HR 62 | Ht 65.0 in | Wt 145.0 lb

## 2022-03-08 DIAGNOSIS — Z01419 Encounter for gynecological examination (general) (routine) without abnormal findings: Secondary | ICD-10-CM | POA: Diagnosis not present

## 2022-03-08 NOTE — Progress Notes (Signed)
? ? ?GYNECOLOGY ANNUAL PREVENTATIVE CARE ENCOUNTER NOTE ? ?History:    ? Jodi Coleman is a 41 y.o. G95P1001 female here for a routine annual gynecologic exam.  Current complaints: menstrual cycle with mirena IUD. Mirena has been placed for 5 years. Started having light spotting every month for 4-5 days.  Denies abnormal vaginal bleeding, discharge, pelvic pain, problems with intercourse or other gynecologic concerns.  ?  ?Gynecologic History ?Patient's last menstrual period was 02/27/2022. ?Contraception: IUD ?Last Pap: 2020. Result was normal with negative HPV ?Last Mammogram: NA.- scheduled this year.  ? ? ?Obstetric History ?OB History  ?Gravida Para Term Preterm AB Living  ?'1 1 1     1  '$ ?SAB IAB Ectopic Multiple Live Births  ?           ?  ?# Outcome Date GA Lbr Len/2nd Weight Sex Delivery Anes PTL Lv  ?1 Term 08/25/06 [redacted]w[redacted]d  M Vag-Spont     ? ? ?Past Medical History:  ?Diagnosis Date  ? Abnormal Pap smear of cervix   ? HPV (human papilloma virus) infection   ? ? ?Past Surgical History:  ?Procedure Laterality Date  ? LEEP    ? REFRACTIVE SURGERY    ? TONSILLECTOMY AND ADENOIDECTOMY    ? ? ?Current Outpatient Medications on File Prior to Visit  ?Medication Sig Dispense Refill  ? levonorgestrel (MIRENA) 20 MCG/24HR IUD 1 each by Intrauterine route once.    ? ?No current facility-administered medications on file prior to visit.  ? ? ?Allergies  ?Allergen Reactions  ? Zoloft [Sertraline Hcl] Rash  ?  Maybe due to Zoloft  ? ? ?Social History:  reports that she has quit smoking. She has never used smokeless tobacco. She reports current alcohol use. She reports that she does not use drugs. ? ?Family History  ?Problem Relation Age of Onset  ? Depression Mother   ? Alcohol abuse Father   ? Depression Father   ?     suicide 1/17  ? Hypertension Father   ? Cancer Maternal Aunt   ? Diabetes Maternal Grandmother   ? Cancer Maternal Grandfather   ? Stroke Paternal Grandmother   ? Heart attack Paternal Grandmother    ? ? ?The following portions of the patient's history were reviewed and updated as appropriate: allergies, current medications, past family history, past medical history, past social history, past surgical history and problem list. ? ?Review of Systems ?Pertinent items noted in HPI and remainder of comprehensive ROS otherwise negative. ? ?Physical Exam:  ?BP 111/74   Pulse 62   Ht '5\' 5"'$  (1.651 m)   Wt 145 lb (65.8 kg)   LMP 02/27/2022   BMI 24.13 kg/m?  ?CONSTITUTIONAL: Well-developed, well-nourished female in no acute distress.  ?HENT:  Normocephalic, atraumatic, External right and left ear normal.  ?EYES: Conjunctivae and EOM are normal. Pupils are equal, round, and reactive to light. No scleral icterus.  ?NECK: Normal range of motion, supple, no masses.  Normal thyroid.  ?SKIN: Skin is warm and dry. No rash noted. Not diaphoretic. No erythema. No pallor. ?MUSCULOSKELETAL: Normal range of motion. No tenderness.  No cyanosis, clubbing, or edema. ?NEUROLOGIC: Alert and oriented to person, place, and time. Normal reflexes, muscle tone coordination.  ?PSYCHIATRIC: Normal mood and affect. Normal behavior. Normal judgment and thought content. ?CARDIOVASCULAR: Normal heart rate noted, regular rhythm ?RESPIRATORY: Clear to auscultation bilaterally. Effort and breath sounds normal, no problems with respiration noted. ?BREASTS: Symmetric in size. No masses, tenderness, skin  changes, nipple drainage, or lymphadenopathy bilaterally. Performed in the presence of a chaperone. ?ABDOMEN: Soft, no distention noted.  No tenderness, rebound or guarding.  ?PELVIC: Normal appearing external genitalia and urethral meatus; normal appearing vaginal mucosa and cervix.  No abnormal vaginal discharge noted.  Pap smear obtained.  Normal uterine size, no other palpable masses, no uterine or adnexal tenderness.  IUD strings visualized and appropriate length.  Performed in the presence of a chaperone. ?  ?Assessment and Plan:  ? ?1.  Women's annual routine gynecological examination ? ?- Cytology - PAP( [facility]) ?- MM Digital Screening; Future ?- TSH ?- Vitamin D (25 hydroxy) ?- CBC  ? ? ?Will follow up results of pap smear and manage accordingly. ?Mammogram scheduled ?Routine preventative health maintenance measures emphasized. ?Please refer to After Visit Summary for other counseling recommendations.  ? ? ?Angelique Chevalier, Artist Pais, NP ?Faculty Practice ?[facility] [place]  ?

## 2022-03-08 NOTE — Progress Notes (Signed)
Pt is having light periods on Mirena ?

## 2022-03-09 LAB — CBC
HCT: 43.4 % (ref 35.0–45.0)
Hemoglobin: 14.7 g/dL (ref 11.7–15.5)
MCH: 33.3 pg — ABNORMAL HIGH (ref 27.0–33.0)
MCHC: 33.9 g/dL (ref 32.0–36.0)
MCV: 98.2 fL (ref 80.0–100.0)
MPV: 10.3 fL (ref 7.5–12.5)
Platelets: 231 10*3/uL (ref 140–400)
RBC: 4.42 10*6/uL (ref 3.80–5.10)
RDW: 11.8 % (ref 11.0–15.0)
WBC: 7.6 10*3/uL (ref 3.8–10.8)

## 2022-03-09 LAB — TSH: TSH: 1.15 mIU/L

## 2022-03-09 LAB — VITAMIN D 25 HYDROXY (VIT D DEFICIENCY, FRACTURES): Vit D, 25-Hydroxy: 31 ng/mL (ref 30–100)

## 2022-03-11 LAB — CYTOLOGY - PAP
Comment: NEGATIVE
Diagnosis: NEGATIVE
High risk HPV: NEGATIVE

## 2022-05-02 ENCOUNTER — Ambulatory Visit (INDEPENDENT_AMBULATORY_CARE_PROVIDER_SITE_OTHER): Payer: BC Managed Care – PPO

## 2022-05-02 DIAGNOSIS — Z01419 Encounter for gynecological examination (general) (routine) without abnormal findings: Secondary | ICD-10-CM

## 2022-05-02 DIAGNOSIS — Z1231 Encounter for screening mammogram for malignant neoplasm of breast: Secondary | ICD-10-CM | POA: Diagnosis not present

## 2022-05-03 ENCOUNTER — Other Ambulatory Visit: Payer: Self-pay | Admitting: Obstetrics and Gynecology

## 2022-05-03 DIAGNOSIS — R928 Other abnormal and inconclusive findings on diagnostic imaging of breast: Secondary | ICD-10-CM

## 2022-05-17 ENCOUNTER — Ambulatory Visit
Admission: RE | Admit: 2022-05-17 | Discharge: 2022-05-17 | Disposition: A | Discharge status: Home / Self Care | Payer: BC Managed Care – PPO | Source: Ambulatory Visit | Attending: Obstetrics and Gynecology | Admitting: Obstetrics and Gynecology

## 2022-05-17 ENCOUNTER — Other Ambulatory Visit: Payer: Self-pay | Admitting: Obstetrics and Gynecology

## 2022-05-17 DIAGNOSIS — N6489 Other specified disorders of breast: Secondary | ICD-10-CM

## 2022-05-17 DIAGNOSIS — R928 Other abnormal and inconclusive findings on diagnostic imaging of breast: Secondary | ICD-10-CM

## 2022-05-24 ENCOUNTER — Ambulatory Visit
Admission: RE | Admit: 2022-05-24 | Discharge: 2022-05-24 | Disposition: A | Discharge status: Home / Self Care | Payer: BC Managed Care – PPO | Source: Ambulatory Visit | Attending: Obstetrics and Gynecology | Admitting: Obstetrics and Gynecology

## 2022-05-24 DIAGNOSIS — N6012 Diffuse cystic mastopathy of left breast: Secondary | ICD-10-CM | POA: Diagnosis not present

## 2022-05-24 DIAGNOSIS — N6489 Other specified disorders of breast: Secondary | ICD-10-CM

## 2022-05-24 DIAGNOSIS — R928 Other abnormal and inconclusive findings on diagnostic imaging of breast: Secondary | ICD-10-CM | POA: Diagnosis not present

## 2022-05-24 DIAGNOSIS — D242 Benign neoplasm of left breast: Secondary | ICD-10-CM | POA: Diagnosis not present

## 2022-05-24 HISTORY — PX: BREAST BIOPSY: SHX20

## 2022-06-14 ENCOUNTER — Ambulatory Visit: Payer: Self-pay | Admitting: Surgery

## 2022-06-14 DIAGNOSIS — D242 Benign neoplasm of left breast: Secondary | ICD-10-CM

## 2022-06-14 NOTE — H&P (Signed)
Subjective   Chief Complaint: New Consultation (Breast papilloma/)     History of Present Illness: Jodi Coleman is a 41 y.o. female who is seen today as an office consultation at the request of Dr. Rolanda Lundborg for evaluation of New Consultation (Breast papilloma/) .    The patient is a 41 year old female who presents after her initial screening mammogram revealed some asymmetry in the left upper outer quadrant.  Ultrasound showed a 1.0 x 0.7 x 0.9 cm circumscribed mass located at 1:00, 5 cm from the nipple.  She underwent stereotactic biopsy of this area that revealed an intraductal papilloma with some usual ductal hyperplasia and fibrocystic changes.  She is now referred to [place] for further discussion.   Review of Systems: A complete review of systems was obtained from the patient.  I have reviewed this information and discussed as appropriate with the patient.  See HPI as well for other ROS.  Review of Systems  Constitutional: Negative.   HENT: Negative.    Eyes: Negative.   Respiratory: Negative.    Cardiovascular: Negative.   Gastrointestinal:  Positive for heartburn.  Genitourinary: Negative.   Musculoskeletal: Negative.   Skin: Negative.   Neurological: Negative.   Endo/Heme/Allergies: Negative.   Psychiatric/Behavioral: Negative.       Medical History: Past Medical History:  Diagnosis Date   Anemia    Anxiety    GERD (gastroesophageal reflux disease)     Patient Active Problem List  Diagnosis   Intraductal papilloma of breast, left   Chronic neck pain   Generalized anxiety disorder   IUD (intrauterine device) in place    History reviewed. No pertinent surgical history.   No Known Allergies  Current Outpatient Medications on File Prior to Visit  Medication Sig Dispense Refill   levonorgestreL (MIRENA 52 MG) IUD Insert into the uterus     No current facility-administered medications on file prior to visit.    Family History  Problem Relation Age of Onset    High blood pressure (Hypertension) Father      Social History   Tobacco Use  Smoking Status Former   Types: Cigarettes  Smokeless Tobacco Never     Social History   Socioeconomic History   Marital status: Married  Tobacco Use   Smoking status: Former    Types: Cigarettes   Smokeless tobacco: Never  Vaping Use   Vaping Use: Never used  Substance and Sexual Activity   Alcohol use: Yes   Drug use: Never    Objective:    Vitals:   06/14/22 0932  BP: 110/70  Temp: 36.7 C (98.1 F)  Weight: 64.5 kg (142 lb 3.2 oz)  Height: 166.4 cm (5' 5.5")    Body mass index is 23.3 kg/m.  Physical Exam   Constitutional:  WDWN in NAD, conversant, no obvious deformities; lying in bed comfortably Eyes:  Pupils equal, round; sclera anicteric; moist conjunctiva; no lid lag HENT:  Oral mucosa moist; good dentition  Neck:  No masses palpated, trachea midline; no thyromegaly Lungs:  CTA bilaterally; normal respiratory effort Breasts:  symmetric, no nipple changes; no palpable masses or lymphadenopathy on either side; bilateral fibrocystic changes CV:  Regular rate and rhythm; no murmurs; extremities well-perfused with no edema Abd:  +bowel sounds, soft, non-tender, no palpable organomegaly; no palpable hernias Musc:  Unable to assess gait; no apparent clubbing or cyanosis in extremities Lymphatic:  No palpable cervical or axillary lymphadenopathy Skin:  Warm, dry; no sign of jaundice Psychiatric - alert and oriented  x 4; calm mood and affect   Labs, Imaging and Diagnostic Testing:  Diagnosis Breast, left, needle core biopsy, upper outer - BENIGN BREAST TISSUE WITH AN INTRADUCTAL PAPILLOMA AND ASSOCIATED BACKGROUND BREAST TISSUE WITH USUAL DUCTAL HYPERPLASIA, FIBROCYSTIC CHANGE, APOCRINE METAPLASIA AND ASSOCIATED MICROCALCIFICATIONS.   CLINICAL DATA:  41 year old female with recalled from baseline screening mammogram for a left breast asymmetry.   EXAM: DIGITAL DIAGNOSTIC  UNILATERAL LEFT MAMMOGRAM WITH TOMOSYNTHESIS AND CAD; ULTRASOUND LEFT BREAST LIMITED   TECHNIQUE: Left digital diagnostic mammography and breast tomosynthesis was performed. The images were evaluated with computer-aided detection.; Targeted ultrasound examination of the left breast was performed.   COMPARISON:  Previous exam(s).   ACR Breast Density Category c: The breast tissue is heterogeneously dense, which may obscure small masses.   FINDINGS: Spot compression tomosynthesis images through the upper slightly outer quadrant of the left breast demonstrates a subtle appearance of distortion.   Ultrasound targeted to the left breast at 1 o'clock, 5 cm from the nipple demonstrates a benign anechoic oval circumscribed mass measuring 1.0 x 0.7 x 0.9 cm. There is an adjacent probable cluster of cysts measuring 0.8 x 0.3 x 0.5 cm. Ultrasound of the left axilla demonstrates multiple normal-appearing lymph nodes.   IMPRESSION: 1. There is an indeterminate subtle distortion in the upper-outer quadrant of the left breast without a sonographic correlate.   2. There is a likely benign cluster of cysts in the left breast at 1 o'clock.   3.  No evidence of left axillary lymphadenopathy.   RECOMMENDATION: Stereotactic biopsy is recommended for the left breast distortion. This has been scheduled for 05/24/2022 at 8:30 a.m.   I have discussed the findings and recommendations with the patient. If applicable, a reminder letter will be sent to the patient regarding the next appointment.   BI-RADS CATEGORY  4: Suspicious.     Electronically Signed   By: Ammie Ferrier M.D.   On: 05/17/2022 10:17    Assessment and Plan:  Diagnoses and all orders for this visit:  Intraductal papilloma of breast, left     Left breast radioactive seed localized lumpectomy.  The surgical procedure has been discussed with the patient.  Potential risks, benefits, alternative treatments, and expected  outcomes have been explained.  All of the patient's questions at this time have been answered.  The likelihood of reaching the patient's treatment goal is good.  The patient understand the proposed surgical procedure and wishes to proceed.   No follow-ups on file.  Presli Fanguy Jearld Adjutant, MD  06/14/2022 9:42 AM

## 2022-06-14 NOTE — H&P (View-Only) (Signed)
Subjective   Chief Complaint: New Consultation (Breast papilloma/)     History of Present Illness: Jodi Coleman is a 41 y.o. female who is seen today as an office consultation at the request of Dr. Rolanda Lundborg for evaluation of New Consultation (Breast papilloma/) .    The patient is a 41 year old female who presents after her initial screening mammogram revealed some asymmetry in the left upper outer quadrant.  Ultrasound showed a 1.0 x 0.7 x 0.9 cm circumscribed mass located at 1:00, 5 cm from the nipple.  She underwent stereotactic biopsy of this area that revealed an intraductal papilloma with some usual ductal hyperplasia and fibrocystic changes.  She is now referred to [place] for further discussion.   Review of Systems: A complete review of systems was obtained from the patient.  I have reviewed this information and discussed as appropriate with the patient.  See HPI as well for other ROS.  Review of Systems  Constitutional: Negative.   HENT: Negative.    Eyes: Negative.   Respiratory: Negative.    Cardiovascular: Negative.   Gastrointestinal:  Positive for heartburn.  Genitourinary: Negative.   Musculoskeletal: Negative.   Skin: Negative.   Neurological: Negative.   Endo/Heme/Allergies: Negative.   Psychiatric/Behavioral: Negative.       Medical History: Past Medical History:  Diagnosis Date   Anemia    Anxiety    GERD (gastroesophageal reflux disease)     Patient Active Problem List  Diagnosis   Intraductal papilloma of breast, left   Chronic neck pain   Generalized anxiety disorder   IUD (intrauterine device) in place    History reviewed. No pertinent surgical history.   No Known Allergies  Current Outpatient Medications on File Prior to Visit  Medication Sig Dispense Refill   levonorgestreL (MIRENA 52 MG) IUD Insert into the uterus     No current facility-administered medications on file prior to visit.    Family History  Problem Relation Age of Onset    High blood pressure (Hypertension) Father      Social History   Tobacco Use  Smoking Status Former   Types: Cigarettes  Smokeless Tobacco Never     Social History   Socioeconomic History   Marital status: Married  Tobacco Use   Smoking status: Former    Types: Cigarettes   Smokeless tobacco: Never  Vaping Use   Vaping Use: Never used  Substance and Sexual Activity   Alcohol use: Yes   Drug use: Never    Objective:    Vitals:   06/14/22 0932  BP: 110/70  Temp: 36.7 C (98.1 F)  Weight: 64.5 kg (142 lb 3.2 oz)  Height: 166.4 cm (5' 5.5")    Body mass index is 23.3 kg/m.  Physical Exam   Constitutional:  WDWN in NAD, conversant, no obvious deformities; lying in bed comfortably Eyes:  Pupils equal, round; sclera anicteric; moist conjunctiva; no lid lag HENT:  Oral mucosa moist; good dentition  Neck:  No masses palpated, trachea midline; no thyromegaly Lungs:  CTA bilaterally; normal respiratory effort Breasts:  symmetric, no nipple changes; no palpable masses or lymphadenopathy on either side; bilateral fibrocystic changes CV:  Regular rate and rhythm; no murmurs; extremities well-perfused with no edema Abd:  +bowel sounds, soft, non-tender, no palpable organomegaly; no palpable hernias Musc:  Unable to assess gait; no apparent clubbing or cyanosis in extremities Lymphatic:  No palpable cervical or axillary lymphadenopathy Skin:  Warm, dry; no sign of jaundice Psychiatric - alert and oriented  x 4; calm mood and affect   Labs, Imaging and Diagnostic Testing:  Diagnosis Breast, left, needle core biopsy, upper outer - BENIGN BREAST TISSUE WITH AN INTRADUCTAL PAPILLOMA AND ASSOCIATED BACKGROUND BREAST TISSUE WITH USUAL DUCTAL HYPERPLASIA, FIBROCYSTIC CHANGE, APOCRINE METAPLASIA AND ASSOCIATED MICROCALCIFICATIONS.   CLINICAL DATA:  41 year old female with recalled from baseline screening mammogram for a left breast asymmetry.   EXAM: DIGITAL DIAGNOSTIC  UNILATERAL LEFT MAMMOGRAM WITH TOMOSYNTHESIS AND CAD; ULTRASOUND LEFT BREAST LIMITED   TECHNIQUE: Left digital diagnostic mammography and breast tomosynthesis was performed. The images were evaluated with computer-aided detection.; Targeted ultrasound examination of the left breast was performed.   COMPARISON:  Previous exam(s).   ACR Breast Density Category c: The breast tissue is heterogeneously dense, which may obscure small masses.   FINDINGS: Spot compression tomosynthesis images through the upper slightly outer quadrant of the left breast demonstrates a subtle appearance of distortion.   Ultrasound targeted to the left breast at 1 o'clock, 5 cm from the nipple demonstrates a benign anechoic oval circumscribed mass measuring 1.0 x 0.7 x 0.9 cm. There is an adjacent probable cluster of cysts measuring 0.8 x 0.3 x 0.5 cm. Ultrasound of the left axilla demonstrates multiple normal-appearing lymph nodes.   IMPRESSION: 1. There is an indeterminate subtle distortion in the upper-outer quadrant of the left breast without a sonographic correlate.   2. There is a likely benign cluster of cysts in the left breast at 1 o'clock.   3.  No evidence of left axillary lymphadenopathy.   RECOMMENDATION: Stereotactic biopsy is recommended for the left breast distortion. This has been scheduled for 05/24/2022 at 8:30 a.m.   I have discussed the findings and recommendations with the patient. If applicable, a reminder letter will be sent to the patient regarding the next appointment.   BI-RADS CATEGORY  4: Suspicious.     Electronically Signed   By: Ammie Ferrier M.D.   On: 05/17/2022 10:17    Assessment and Plan:  Diagnoses and all orders for this visit:  Intraductal papilloma of breast, left     Left breast radioactive seed localized lumpectomy.  The surgical procedure has been discussed with the patient.  Potential risks, benefits, alternative treatments, and expected  outcomes have been explained.  All of the patient's questions at this time have been answered.  The likelihood of reaching the patient's treatment goal is good.  The patient understand the proposed surgical procedure and wishes to proceed.   No follow-ups on file.  Jodi Nebergall Jearld Adjutant, MD  06/14/2022 9:42 AM

## 2022-06-20 ENCOUNTER — Other Ambulatory Visit: Payer: Self-pay | Admitting: Surgery

## 2022-06-20 DIAGNOSIS — D242 Benign neoplasm of left breast: Secondary | ICD-10-CM

## 2022-07-02 ENCOUNTER — Encounter (HOSPITAL_BASED_OUTPATIENT_CLINIC_OR_DEPARTMENT_OTHER): Payer: Self-pay | Admitting: Surgery

## 2022-07-02 ENCOUNTER — Other Ambulatory Visit: Payer: Self-pay

## 2022-07-09 ENCOUNTER — Ambulatory Visit
Admission: RE | Admit: 2022-07-09 | Discharge: 2022-07-09 | Disposition: A | Discharge status: Home / Self Care | Payer: BC Managed Care – PPO | Source: Ambulatory Visit | Attending: Surgery | Admitting: Surgery

## 2022-07-09 DIAGNOSIS — D242 Benign neoplasm of left breast: Secondary | ICD-10-CM

## 2022-07-09 DIAGNOSIS — R928 Other abnormal and inconclusive findings on diagnostic imaging of breast: Secondary | ICD-10-CM | POA: Diagnosis not present

## 2022-07-09 NOTE — Progress Notes (Addendum)
Sent text reminding pt to come in for pre surgery drink and soap.   Surgical soap given with instructions, pt verbalized understanding. Enhanced Recovery after Surgery  Enhanced Recovery after Surgery is a protocol used to improve the stress on your body and your recovery after surgery.  Patient Instructions  The night before surgery:  No food after midnight. ONLY clear liquids after midnight  The day of surgery (if you do NOT have diabetes):  Drink ONE (1) Pre-Surgery Clear Ensure as directed.   This drink was given to you during your hospital  pre-op appointment visit. The pre-op nurse will instruct you on the time to drink the  Pre-Surgery Ensure depending on your surgery time. Finish the drink at the designated time by the pre-op nurse.  Nothing else to drink after completing the  Pre-Surgery Clear Ensure.  The day of surgery (if you have diabetes): Drink ONE (1) Gatorade 2 (G2) as directed. This drink was given to you during your hospital  pre-op appointment visit.  The pre-op nurse will instruct you on the time to drink the   Gatorade 2 (G2) depending on your surgery time. Color of the Gatorade may vary. Red is not allowed. Nothing else to drink after completing the  Gatorade 2 (G2).         If office.you have questions, please contact your surgeon's office

## 2022-07-10 ENCOUNTER — Encounter (HOSPITAL_BASED_OUTPATIENT_CLINIC_OR_DEPARTMENT_OTHER)
Admission: RE | Disposition: A | Discharge status: Home / Self Care | Payer: Self-pay | Source: Ambulatory Visit | Attending: Surgery

## 2022-07-10 ENCOUNTER — Ambulatory Visit
Admission: RE | Admit: 2022-07-10 | Discharge: 2022-07-10 | Disposition: A | Discharge status: Home / Self Care | Payer: BC Managed Care – PPO | Source: Ambulatory Visit | Attending: Surgery | Admitting: Surgery

## 2022-07-10 ENCOUNTER — Ambulatory Visit (HOSPITAL_BASED_OUTPATIENT_CLINIC_OR_DEPARTMENT_OTHER): Payer: BC Managed Care – PPO | Admitting: Anesthesiology

## 2022-07-10 ENCOUNTER — Other Ambulatory Visit: Payer: Self-pay

## 2022-07-10 ENCOUNTER — Encounter (HOSPITAL_BASED_OUTPATIENT_CLINIC_OR_DEPARTMENT_OTHER): Payer: Self-pay | Admitting: Surgery

## 2022-07-10 ENCOUNTER — Ambulatory Visit (HOSPITAL_BASED_OUTPATIENT_CLINIC_OR_DEPARTMENT_OTHER)
Admission: RE | Admit: 2022-07-10 | Discharge: 2022-07-10 | Disposition: A | Discharge status: Home / Self Care | Payer: BC Managed Care – PPO | Source: Ambulatory Visit | Attending: Surgery | Admitting: Surgery

## 2022-07-10 DIAGNOSIS — D242 Benign neoplasm of left breast: Secondary | ICD-10-CM | POA: Diagnosis not present

## 2022-07-10 DIAGNOSIS — R921 Mammographic calcification found on diagnostic imaging of breast: Secondary | ICD-10-CM | POA: Diagnosis not present

## 2022-07-10 DIAGNOSIS — Z87891 Personal history of nicotine dependence: Secondary | ICD-10-CM | POA: Diagnosis not present

## 2022-07-10 DIAGNOSIS — N62 Hypertrophy of breast: Secondary | ICD-10-CM | POA: Diagnosis not present

## 2022-07-10 DIAGNOSIS — Z01818 Encounter for other preprocedural examination: Secondary | ICD-10-CM

## 2022-07-10 DIAGNOSIS — N6012 Diffuse cystic mastopathy of left breast: Secondary | ICD-10-CM | POA: Diagnosis not present

## 2022-07-10 DIAGNOSIS — R928 Other abnormal and inconclusive findings on diagnostic imaging of breast: Secondary | ICD-10-CM | POA: Diagnosis not present

## 2022-07-10 HISTORY — DX: Gastro-esophageal reflux disease without esophagitis: K21.9

## 2022-07-10 HISTORY — PX: RADIOACTIVE SEED GUIDED EXCISIONAL BREAST BIOPSY: SHX6490

## 2022-07-10 HISTORY — DX: Benign neoplasm of left breast: D24.2

## 2022-07-10 HISTORY — DX: Anxiety disorder, unspecified: F41.9

## 2022-07-10 LAB — POCT PREGNANCY, URINE: Preg Test, Ur: NEGATIVE

## 2022-07-10 SURGERY — RADIOACTIVE SEED GUIDED BREAST BIOPSY
Anesthesia: General | Site: Breast | Laterality: Left

## 2022-07-10 MED ORDER — LACTATED RINGERS IV SOLN: INTRAVENOUS | Status: DC

## 2022-07-10 MED ORDER — PHENYLEPHRINE HCL (PRESSORS) 10 MG/ML IV SOLN
INTRAVENOUS | Status: DC | PRN
  Administered 2022-07-10 (×2): 40 ug via INTRAVENOUS

## 2022-07-10 MED ORDER — CEFAZOLIN SODIUM-DEXTROSE 2-4 GM/100ML-% IV SOLN
2.0000 g | INTRAVENOUS | Status: AC
  Administered 2022-07-10: 2 g via INTRAVENOUS

## 2022-07-10 MED ORDER — PROPOFOL 10 MG/ML IV BOLUS
INTRAVENOUS | Status: DC | PRN
  Administered 2022-07-10: 150 mg via INTRAVENOUS

## 2022-07-10 MED ORDER — CHLORHEXIDINE GLUCONATE CLOTH 2 % EX PADS: 6.0000 | MEDICATED_PAD | Freq: Once | CUTANEOUS | Status: DC

## 2022-07-10 MED ORDER — LIDOCAINE HCL (CARDIAC) PF 100 MG/5ML IV SOSY
PREFILLED_SYRINGE | INTRAVENOUS | Status: DC | PRN
  Administered 2022-07-10: 60 mg via INTRAVENOUS

## 2022-07-10 MED ORDER — MIDAZOLAM HCL 5 MG/5ML IJ SOLN
INTRAMUSCULAR | Status: DC | PRN
  Administered 2022-07-10: 2 mg via INTRAVENOUS

## 2022-07-10 MED ORDER — ACETAMINOPHEN 500 MG PO TABS
1000.0000 mg | ORAL_TABLET | ORAL | Status: AC
  Administered 2022-07-10: 1000 mg via ORAL

## 2022-07-10 MED ORDER — ACETAMINOPHEN 500 MG PO TABS
ORAL_TABLET | ORAL | Status: AC
  Filled 2022-07-10: qty 2

## 2022-07-10 MED ORDER — CEFAZOLIN SODIUM-DEXTROSE 2-4 GM/100ML-% IV SOLN
INTRAVENOUS | Status: AC
  Filled 2022-07-10: qty 100

## 2022-07-10 MED ORDER — MIDAZOLAM HCL 2 MG/2ML IJ SOLN
INTRAMUSCULAR | Status: AC
  Filled 2022-07-10: qty 2

## 2022-07-10 MED ORDER — PROPOFOL 500 MG/50ML IV EMUL
INTRAVENOUS | Status: DC | PRN
  Administered 2022-07-10: 25 ug/kg/min via INTRAVENOUS

## 2022-07-10 MED ORDER — BUPIVACAINE-EPINEPHRINE 0.25% -1:200000 IJ SOLN
INTRAMUSCULAR | Status: DC | PRN
  Administered 2022-07-10: 10 mL

## 2022-07-10 MED ORDER — ONDANSETRON HCL 4 MG/2ML IJ SOLN
INTRAMUSCULAR | Status: DC | PRN
  Administered 2022-07-10: 4 mg via INTRAVENOUS

## 2022-07-10 MED ORDER — HYDROMORPHONE HCL 1 MG/ML IJ SOLN: 0.2500 mg | INTRAMUSCULAR | Status: DC | PRN

## 2022-07-10 MED ORDER — FENTANYL CITRATE (PF) 100 MCG/2ML IJ SOLN
INTRAMUSCULAR | Status: AC
  Filled 2022-07-10: qty 2

## 2022-07-10 MED ORDER — FENTANYL CITRATE (PF) 100 MCG/2ML IJ SOLN
INTRAMUSCULAR | Status: DC | PRN
  Administered 2022-07-10 (×2): 50 ug via INTRAVENOUS

## 2022-07-10 SURGICAL SUPPLY — 46 items
APL PRP STRL LF DISP 70% ISPRP (MISCELLANEOUS) ×1
APL SKNCLS STERI-STRIP NONHPOA (GAUZE/BANDAGES/DRESSINGS) ×1
APPLIER CLIP 9.375 MED OPEN (MISCELLANEOUS)
APR CLP MED 9.3 20 MLT OPN (MISCELLANEOUS)
BENZOIN TINCTURE PRP APPL 2/3 (GAUZE/BANDAGES/DRESSINGS) ×2 IMPLANT
BLADE HEX COATED 2.75 (ELECTRODE) ×2 IMPLANT
BLADE SURG 15 STRL LF DISP TIS (BLADE) ×1 IMPLANT
BLADE SURG 15 STRL SS (BLADE) ×2
CANISTER SUCT 1200ML W/VALVE (MISCELLANEOUS) ×2 IMPLANT
CHLORAPREP W/TINT 26 (MISCELLANEOUS) ×2 IMPLANT
CLIP APPLIE 9.375 MED OPEN (MISCELLANEOUS) IMPLANT
COVER BACK TABLE 60X90IN (DRAPES) ×2 IMPLANT
COVER MAYO STAND STRL (DRAPES) ×2 IMPLANT
COVER PROBE W GEL 5X96 (DRAPES) ×2 IMPLANT
DRAPE LAPAROTOMY 100X72 PEDS (DRAPES) ×2 IMPLANT
DRAPE UTILITY XL STRL (DRAPES) ×2 IMPLANT
DRSG TEGADERM 4X4.75 (GAUZE/BANDAGES/DRESSINGS) ×2 IMPLANT
ELECT REM PT RETURN 9FT ADLT (ELECTROSURGICAL) ×2
ELECTRODE REM PT RTRN 9FT ADLT (ELECTROSURGICAL) ×1 IMPLANT
GAUZE SPONGE 4X4 12PLY STRL LF (GAUZE/BANDAGES/DRESSINGS) ×2 IMPLANT
GLOVE BIO SURGEON STRL SZ 6.5 (GLOVE) ×2 IMPLANT
GLOVE BIO SURGEON STRL SZ7 (GLOVE) ×2 IMPLANT
GLOVE BIOGEL PI IND STRL 6.5 (GLOVE) IMPLANT
GLOVE BIOGEL PI IND STRL 7.5 (GLOVE) ×1 IMPLANT
GLOVE BIOGEL PI INDICATOR 6.5 (GLOVE) ×2
GLOVE BIOGEL PI INDICATOR 7.5 (GLOVE) ×1
GOWN STRL REUS W/ TWL LRG LVL3 (GOWN DISPOSABLE) ×2 IMPLANT
GOWN STRL REUS W/TWL LRG LVL3 (GOWN DISPOSABLE) ×6
KIT MARKER MARGIN INK (KITS) ×2 IMPLANT
NDL HYPO 25X1 1.5 SAFETY (NEEDLE) ×1 IMPLANT
NEEDLE HYPO 25X1 1.5 SAFETY (NEEDLE) ×2 IMPLANT
NS IRRIG 1000ML POUR BTL (IV SOLUTION) ×2 IMPLANT
PACK BASIN DAY SURGERY FS (CUSTOM PROCEDURE TRAY) ×2 IMPLANT
PENCIL SMOKE EVACUATOR (MISCELLANEOUS) ×2 IMPLANT
SLEEVE SCD COMPRESS KNEE MED (STOCKING) ×2 IMPLANT
SPONGE T-LAP 4X18 ~~LOC~~+RFID (SPONGE) ×2 IMPLANT
STRIP CLOSURE SKIN 1/2X4 (GAUZE/BANDAGES/DRESSINGS) ×2 IMPLANT
SUT MON AB 4-0 PC3 18 (SUTURE) ×2 IMPLANT
SUT SILK 2 0 SH (SUTURE) IMPLANT
SUT VIC AB 3-0 SH 27 (SUTURE) ×2
SUT VIC AB 3-0 SH 27X BRD (SUTURE) ×1 IMPLANT
SYR CONTROL 10ML LL (SYRINGE) ×2 IMPLANT
TOWEL GREEN STERILE FF (TOWEL DISPOSABLE) ×2 IMPLANT
TRAY FAXITRON CT DISP (TRAY / TRAY PROCEDURE) ×2 IMPLANT
TUBE CONNECTING 20X1/4 (TUBING) ×2 IMPLANT
YANKAUER SUCT BULB TIP NO VENT (SUCTIONS) ×2 IMPLANT

## 2022-07-10 NOTE — Anesthesia Postprocedure Evaluation (Signed)
Anesthesia Post Note  Patient: Jodi Coleman  Procedure(s) Performed: LEFT BREAST RADIOACTIVE SEED LOCALIZED EXCISIONAL BREAST BIOPSY (Left: Breast)     Patient location during evaluation: PACU Anesthesia Type: General Level of consciousness: awake Pain management: pain level controlled Vital Signs Assessment: post-procedure vital signs reviewed and stable Respiratory status: spontaneous breathing Cardiovascular status: stable Postop Assessment: no apparent nausea or vomiting Anesthetic complications: no   No notable events documented.  Last Vitals:  Vitals:   07/10/22 1045 07/10/22 1100  BP: 106/65 104/70  Pulse: 100 87  Resp: 18 14  Temp:    SpO2: 100% 99%    Last Pain:  Vitals:   07/10/22 1100  TempSrc:   PainSc: 0-No pain                 Deyona Soza

## 2022-07-10 NOTE — Op Note (Addendum)
Pre-op Diagnosis:  Left breast intraductal papilloma Post-op Diagnosis: same Procedure:  Left breast radioactive seed localized lumpectomy Surgeon:  Salim Forero K. Assistant:  Malachi Pro, PA-C Anesthesia:  GEN - LMA Indications:  The patient is a 41 year old female who presents after her initial screening mammogram revealed some asymmetry in the left upper outer quadrant.  Ultrasound showed a 1.0 x 0.7 x 0.9 cm circumscribed mass located at 1:00, 5 cm from the nipple.  She underwent stereotactic biopsy of this area that revealed an intraductal papilloma with some usual ductal hyperplasia and fibrocystic changes.  She is now referred to [place] for excision.  Radioactive seed was placed by radiology yesterday. Description of procedure: The patient is brought to the operating room placed in supine position on the operating room table. After an adequate level of general anesthesia was obtained, her left breast was prepped with ChloraPrep and draped in sterile fashion. A timeout was taken to ensure the proper patient and proper procedure. We interrogated the breast with the neoprobe. We made a circumareolar incision around the upper lateral side of the nipple after infiltrating with 0.25% Marcaine. Dissection was carried down in the breast tissue with cautery. We used the neoprobe to guide [place] towards the radioactive seed. We excised an area of tissue around the radioactive seed 2 cm in diameter. The specimen was removed and was oriented with a paint kit. Specimen mammogram showed the radioactive seed as well as the biopsy clip within the specimen. This was sent for pathologic examination. There is no residual radioactivity within the biopsy cavity. We inspected carefully for hemostasis. The wound was thoroughly irrigated. The wound was closed with a deep layer of 3-0 Vicryl and a subcuticular layer of 4-0 Monocryl. Benzoin Steri-Strips were applied. The patient was then extubated and brought to the recovery room in  stable condition. All sponge, instrument, and needle counts are correct.  Imogene Burn. Georgette Dover, MD, [facility]  General/ Trauma Surgery  07/10/2022 10:22 AM

## 2022-07-10 NOTE — Anesthesia Procedure Notes (Signed)
Procedure Name: LMA Insertion Date/Time: 07/10/2022 9:45 AM  Performed by: Marolyn Urschel, Ernesta Amble, CRNAPre-anesthesia Checklist: Patient identified, Emergency Drugs available, Suction available and Patient being monitored Patient Re-evaluated:Patient Re-evaluated prior to induction Oxygen Delivery Method: Circle system utilized Preoxygenation: Pre-oxygenation with 100% oxygen Induction Type: IV induction Ventilation: Mask ventilation without difficulty LMA: LMA inserted LMA Size: 4.0 Number of attempts: 1 Airway Equipment and Method: Bite block Placement Confirmation: positive ETCO2 Tube secured with: Tape Dental Injury: Teeth and Oropharynx as per pre-operative assessment

## 2022-07-10 NOTE — Discharge Instructions (Addendum)
No tylenol until 2:45 p.m.  [place] Office Phone Number [phone]  BREAST BIOPSY/ PARTIAL MASTECTOMY: POST OP INSTRUCTIONS  Always review your discharge instruction sheet given to you by the facility where your surgery was performed.  IF YOU HAVE DISABILITY OR FAMILY LEAVE FORMS, YOU MUST BRING THEM TO THE OFFICE FOR PROCESSING.  DO NOT GIVE THEM TO YOUR DOCTOR.  A prescription for pain medication may be given to you upon discharge.  Take your pain medication as prescribed, if needed.  If narcotic pain medicine is not needed, then you may take acetaminophen (Tylenol) or ibuprofen (Advil) as needed. Take your usually prescribed medications unless otherwise directed If you need a refill on your pain medication, please contact your pharmacy.  They will contact our office to request authorization.  Prescriptions will not be filled after 5pm or on week-ends. You should eat very light the first 24 hours after surgery, such as soup, crackers, pudding, etc.  Resume your normal diet the day after surgery. Most patients will experience some swelling and bruising in the breast.  Ice packs and a good support bra will help.  Swelling and bruising can take several days to resolve.  It is common to experience some constipation if taking pain medication after surgery.  Increasing fluid intake and taking a stool softener will usually help or prevent this problem from occurring.  A mild laxative (Milk of Magnesia or Miralax) should be taken according to package directions if there are no bowel movements after 48 hours. Unless discharge instructions indicate otherwise, you may remove your bandages 48 hours after surgery, and you may shower at that time.  You will have steri-strips (small skin tapes) in place directly over the incision.  These strips should be left on the skin for 7-10 days.   Any sutures or staples will be removed at the office during your follow-up visit. ACTIVITIES:  You may  resume regular daily activities (gradually increasing) beginning the next day.  Wearing a good support bra or sports bra minimizes pain and swelling.  You may have sexual intercourse when it is comfortable. You may drive when you no longer are taking prescription pain medication, you can comfortably wear a seatbelt, and you can safely maneuver your car and apply brakes. RETURN TO WORK:  1-2 weeks You should see your doctor in the office for a follow-up appointment approximately two weeks after your surgery.  Your doctor's nurse will typically make your follow-up appointment when she calls you with your pathology report.  Expect your pathology report 2-3 business days after your surgery.  You may call to check if you do not hear from [place] after three days. OTHER INSTRUCTIONS: _______________________________________________________________________________________________ _____________________________________________________________________________________________________________________________________ _____________________________________________________________________________________________________________________________________ _____________________________________________________________________________________________________________________________________  WHEN TO CALL YOUR DOCTOR: Fever over 101.0 Nausea and/or vomiting. Extreme swelling or bruising. Continued bleeding from incision. Increased pain, redness, or drainage from the incision.  The clinic staff is available to answer your questions during regular business hours.  Please don't hesitate to call and ask to speak to one of the nurses for clinical concerns.  If you have a medical emergency, go to the nearest emergency room or call 911.  A surgeon from [facility] is always on call at the hospital.  For further questions, please visit [email or web address]   Post Anesthesia Home Care Instructions  Activity: Get  plenty of rest for the remainder of the day. A responsible individual must stay with you for 24 hours following the procedure.  For the next 24  hours, DO NOT: -Drive a car -Paediatric nurse -Drink alcoholic beverages -Take any medication unless instructed by your physician -Make any legal decisions or sign important papers.  Meals: Start with liquid foods such as gelatin or soup. Progress to regular foods as tolerated. Avoid greasy, spicy, heavy foods. If nausea and/or vomiting occur, drink only clear liquids until the nausea and/or vomiting subsides. Call your physician if vomiting continues.  Special Instructions/Symptoms: Your throat may feel dry or sore from the anesthesia or the breathing tube placed in your throat during surgery. If this causes discomfort, gargle with warm salt water. The discomfort should disappear within 24 hours.  If you had a scopolamine patch placed behind your ear for the management of post- operative nausea and/or vomiting:  1. The medication in the patch is effective for 72 hours, after which it should be removed.  Wrap patch in a tissue and discard in the trash. Wash hands thoroughly with soap and water. 2. You may remove the patch earlier than 72 hours if you experience unpleasant side effects which may include dry mouth, dizziness or visual disturbances. 3. Avoid touching the patch. Wash your hands with soap and water after contact with the patch.

## 2022-07-10 NOTE — Anesthesia Preprocedure Evaluation (Addendum)
Anesthesia Evaluation  Patient identified by MRN, date of birth, ID band Patient awake    Reviewed: Allergy & Precautions, NPO status , Patient's Chart, lab work & pertinent test results  Airway Mallampati: II  TM Distance: >3 FB     Dental   Pulmonary former smoker,    breath sounds clear to auscultation       Cardiovascular negative cardio ROS   Rhythm:Regular Rate:Normal     Neuro/Psych PSYCHIATRIC DISORDERS  Neuromuscular disease    GI/Hepatic Neg liver ROS, GERD  ,  Endo/Other    Renal/GU negative Renal ROS     Musculoskeletal   Abdominal   Peds  Hematology   Anesthesia Other Findings   Reproductive/Obstetrics                             Anesthesia Physical Anesthesia Plan  ASA: 2  Anesthesia Plan: General   Post-op Pain Management:    Induction: Intravenous  PONV Risk Score and Plan: 3 and Ondansetron, Dexamethasone and Midazolam  Airway Management Planned: Nasal ETT  Additional Equipment:   Intra-op Plan:   Post-operative Plan: Extubation in OR  Informed Consent: I have reviewed the patients History and Physical, chart, labs and discussed the procedure including the risks, benefits and alternatives for the proposed anesthesia with the patient or authorized representative who has indicated his/her understanding and acceptance.     Dental advisory given  Plan Discussed with: CRNA and Anesthesiologist  Anesthesia Plan Comments:         Anesthesia Quick Evaluation

## 2022-07-10 NOTE — Transfer of Care (Signed)
Immediate Anesthesia Transfer of Care Note  Patient: Jodi Coleman  Procedure(s) Performed: LEFT BREAST RADIOACTIVE SEED LOCALIZED EXCISIONAL BREAST BIOPSY (Left: Breast)  Patient Location: PACU  Anesthesia Type:General  Level of Consciousness: awake, alert , oriented and patient cooperative  Airway & Oxygen Therapy: Patient Spontanous Breathing and Patient connected to face mask oxygen  Post-op Assessment: Report given to RN and Post -op Vital signs reviewed and stable  Post vital signs: Reviewed and stable  Last Vitals:  Vitals Value Taken Time  BP    Temp    Pulse 100 07/10/22 1034  Resp    SpO2 100 % 07/10/22 1034    Last Pain:  Vitals:   07/10/22 0841  TempSrc: Oral  PainSc: 0-No pain         Complications: No notable events documented.

## 2022-07-10 NOTE — Interval H&P Note (Signed)
History and Physical Interval Note:  07/10/2022 8:20 AM  Jodi Coleman  has presented today for surgery, with the diagnosis of LEFT BREAST INTRADUCTAL PAPILLOMA.  The various methods of treatment have been discussed with the patient and family. After consideration of risks, benefits and other options for treatment, the patient has consented to  Procedure(s) with comments: LEFT BREAST RADIOACTIVE SEED LOCALIZED EXCISIONAL BREAST BIOPSY (Left) - LMA as a surgical intervention.  The patient's history has been reviewed, patient examined, no change in status, stable for surgery.  I have reviewed the patient's chart and labs.  Questions were answered to the patient's satisfaction.     [name]

## 2022-07-11 ENCOUNTER — Encounter (HOSPITAL_BASED_OUTPATIENT_CLINIC_OR_DEPARTMENT_OTHER): Payer: Self-pay | Admitting: Surgery

## 2022-07-11 LAB — SURGICAL PATHOLOGY

## 2022-07-16 ENCOUNTER — Encounter (HOSPITAL_COMMUNITY): Payer: Self-pay

## 2022-10-04 IMAGING — US US BREAST*L* LIMITED INC AXILLA
1 series · 13 of 16 positions shown · non-contrast
Comparison: Previous exam(s).

CLINICAL DATA: 40-year-old female with recalled from baseline
screening mammogram for a left breast asymmetry.

EXAM:
DIGITAL DIAGNOSTIC UNILATERAL LEFT MAMMOGRAM WITH TOMOSYNTHESIS AND
CAD; ULTRASOUND LEFT BREAST LIMITED
TECHNIQUE: Left digital diagnostic mammography and breast tomosynthesis was
performed. The images were evaluated with computer-aided detection.;
Targeted ultrasound examination of the left breast was performed.

[Series 1: us breast*left* limited inc axilla · 0.07mm/px · 13 of 16 slices shown]
[im 1/16]
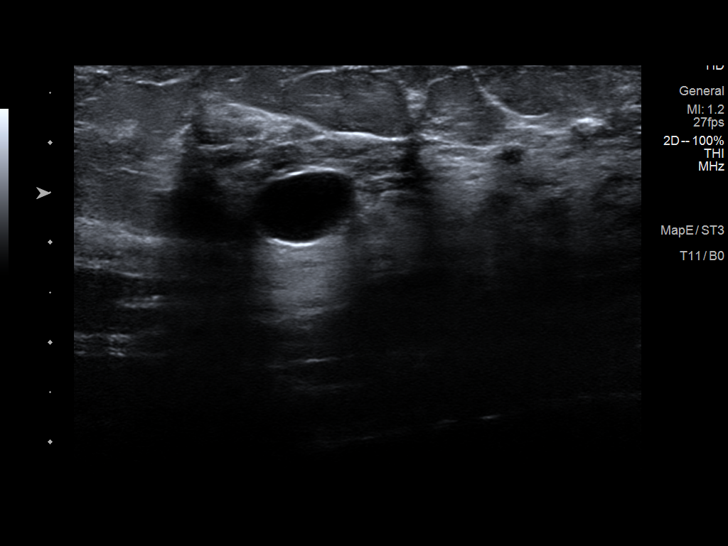
[im 2/16]
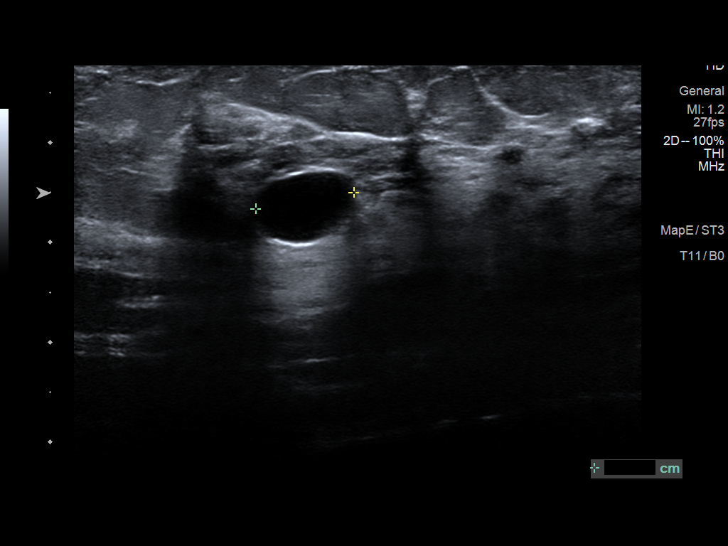
[im 4/16]
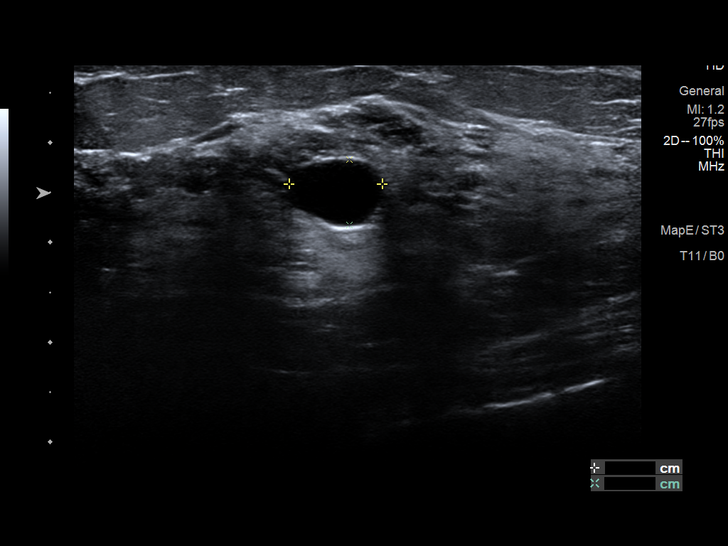
[im 5/16]
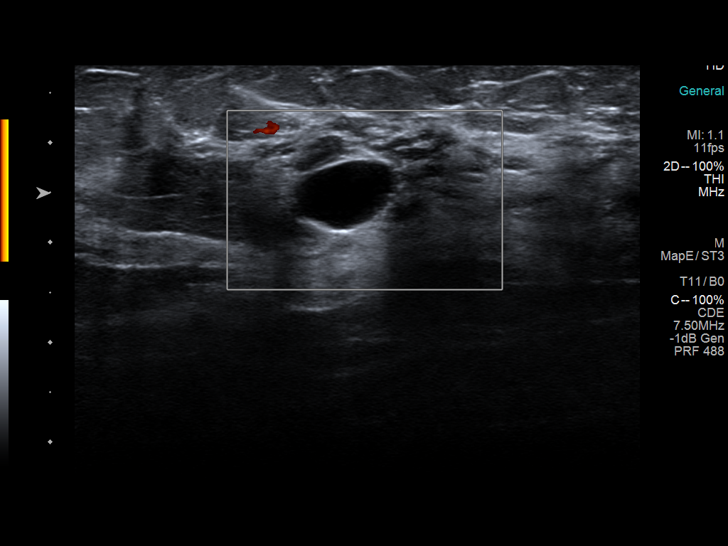
[im 6/16]
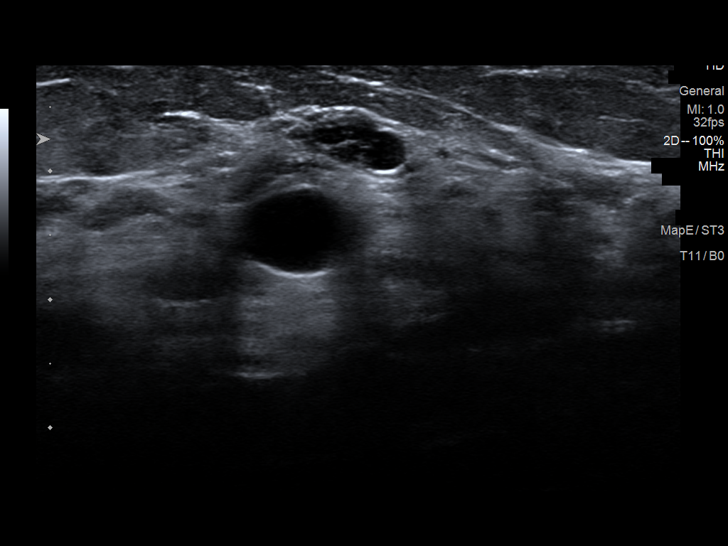
[im 7/16]
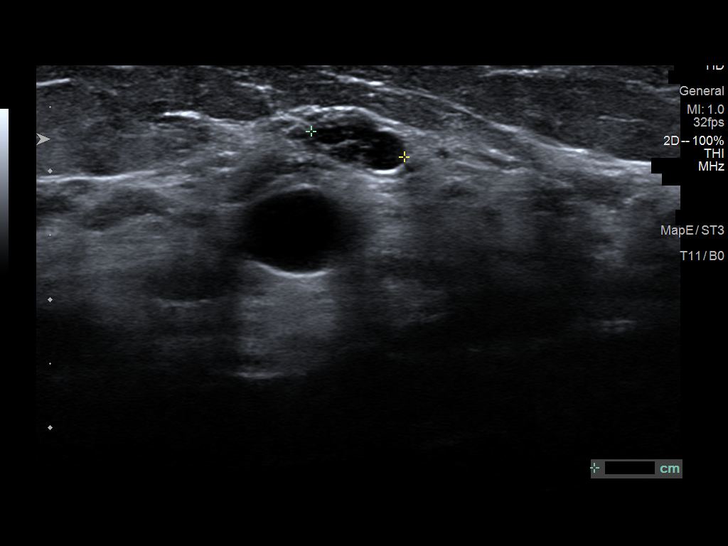
[im 9/16]
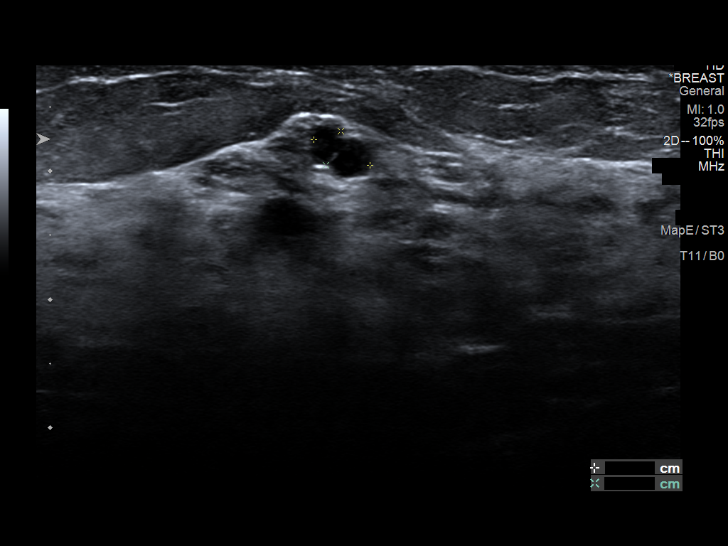
[im 10/16]
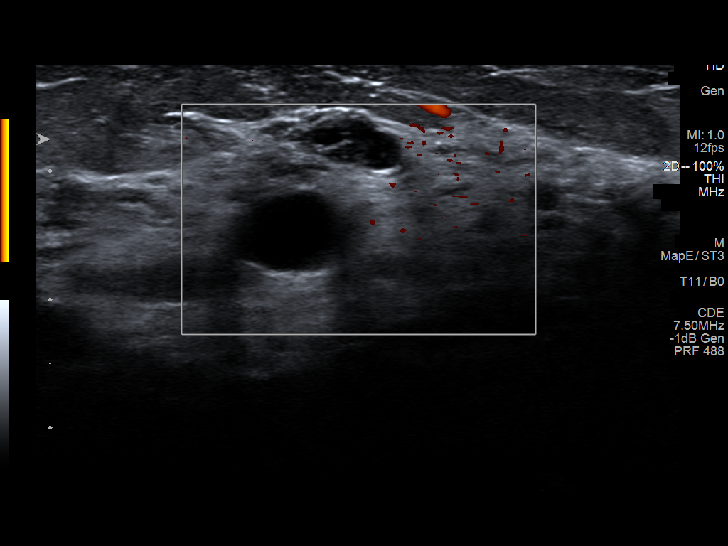
[im 11/16]
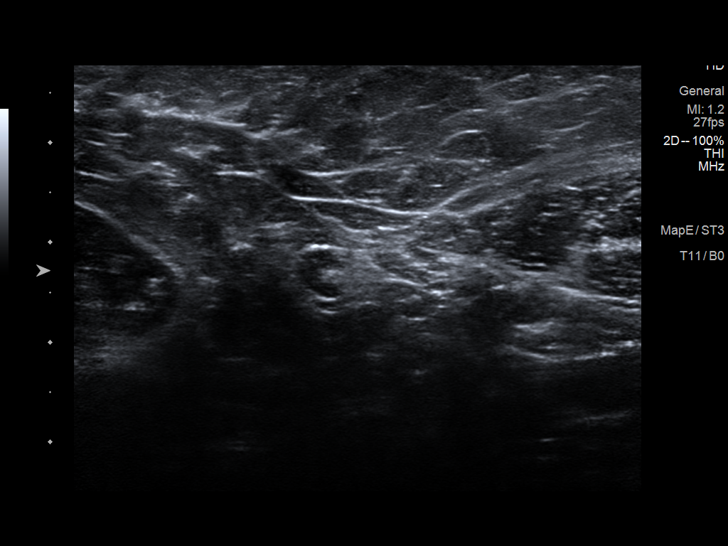
[im 12/16]
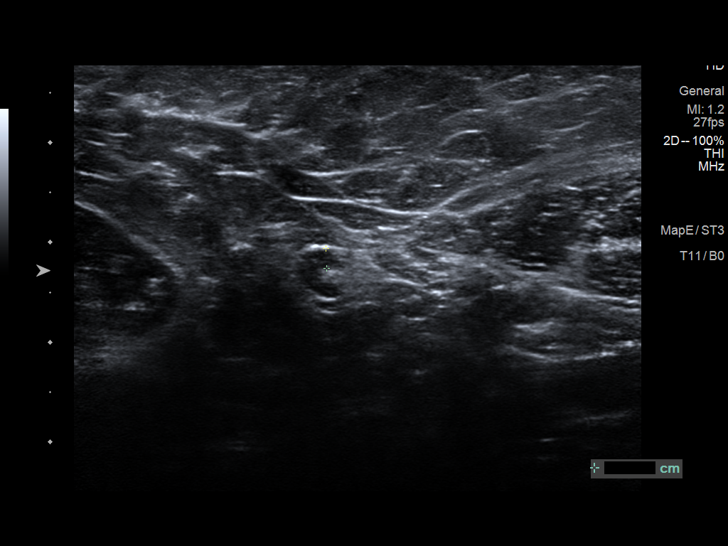
[im 13/16]
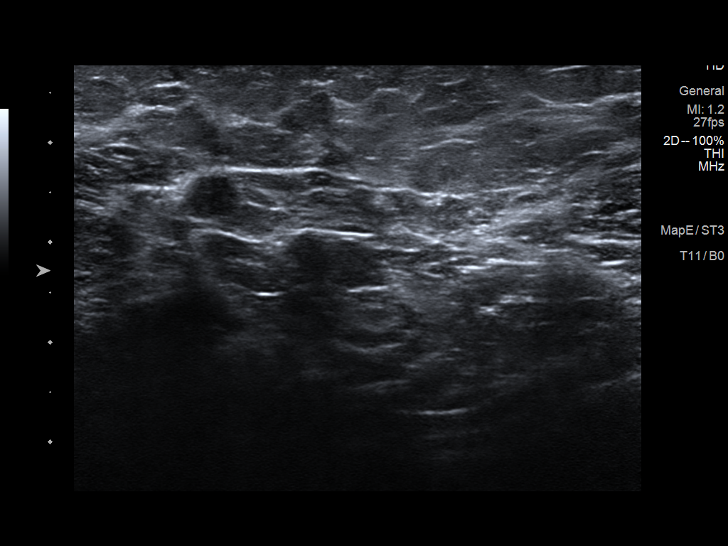
[im 15/16]
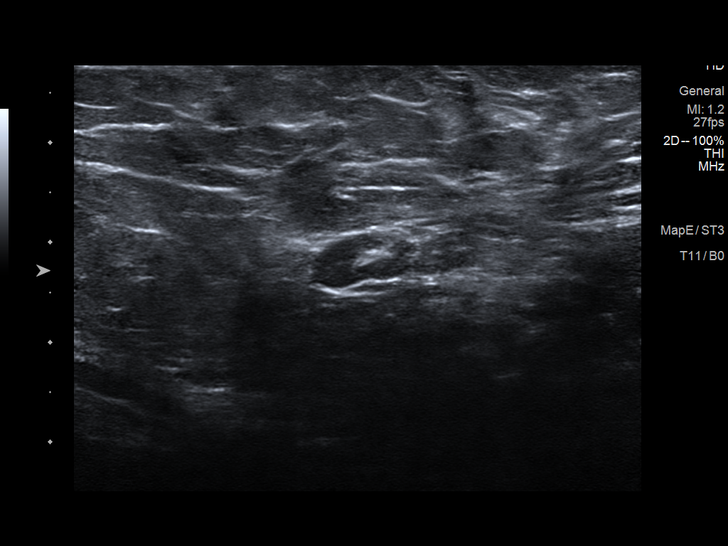
[im 16/16]
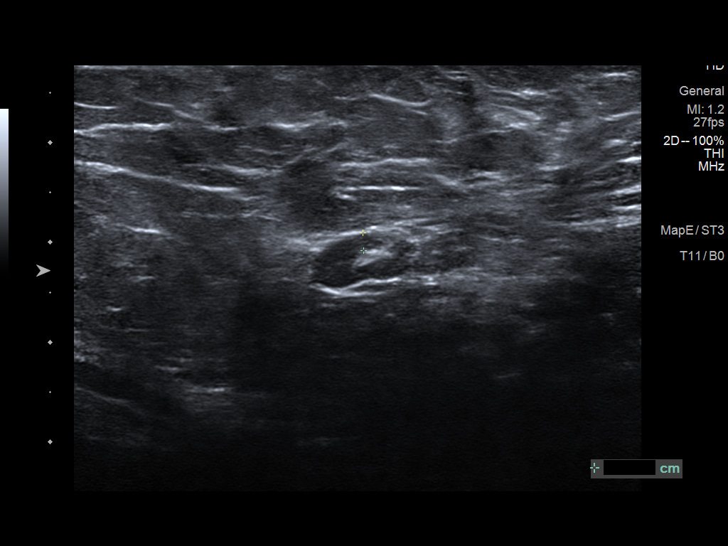

[13 of 16 positions shown; findings below may reference images not displayed]

ACR Breast Density Category c: The breast tissue is heterogeneously
dense, which may obscure small masses.
FINDINGS: Spot compression tomosynthesis images through the upper slightly
outer quadrant of the left breast demonstrates a subtle appearance
of distortion.

Ultrasound targeted to the left breast at 1 o'clock, 5 cm from the
nipple demonstrates a benign anechoic oval circumscribed mass
measuring 1.0 x 0.7 x 0.9 cm. There is an adjacent probable cluster
of cysts measuring 0.8 x 0.3 x 0.5 cm. Ultrasound of the left axilla
demonstrates multiple normal-appearing lymph nodes.
IMPRESSION: 1. There is an indeterminate subtle distortion in the upper-outer
quadrant of the left breast without a sonographic correlate.

2. There is a likely benign cluster of cysts in the left breast at 1
o'clock.

3.  No evidence of left axillary lymphadenopathy.

RECOMMENDATION:
Stereotactic biopsy is recommended for the left breast distortion.
This has been scheduled for 05/24/2022 at [DATE] a.m.

I have discussed the findings and recommendations with the patient.
If applicable, a reminder letter will be sent to the patient
regarding the next appointment.

BI-RADS CATEGORY  4: Suspicious.

## 2022-10-04 IMAGING — MG MM DIGITAL DIAGNOSTIC UNILAT*L* W/ TOMO W/ CAD
6 series · 6 of 18 positions shown · non-contrast
Comparison: Previous exam(s).

CLINICAL DATA: 40-year-old female with recalled from baseline
screening mammogram for a left breast asymmetry.

EXAM:
DIGITAL DIAGNOSTIC UNILATERAL LEFT MAMMOGRAM WITH TOMOSYNTHESIS AND
CAD; ULTRASOUND LEFT BREAST LIMITED
TECHNIQUE: Left digital diagnostic mammography and breast tomosynthesis was
performed. The images were evaluated with computer-aided detection.;
Targeted ultrasound examination of the left breast was performed.

[L ML synth-2D]
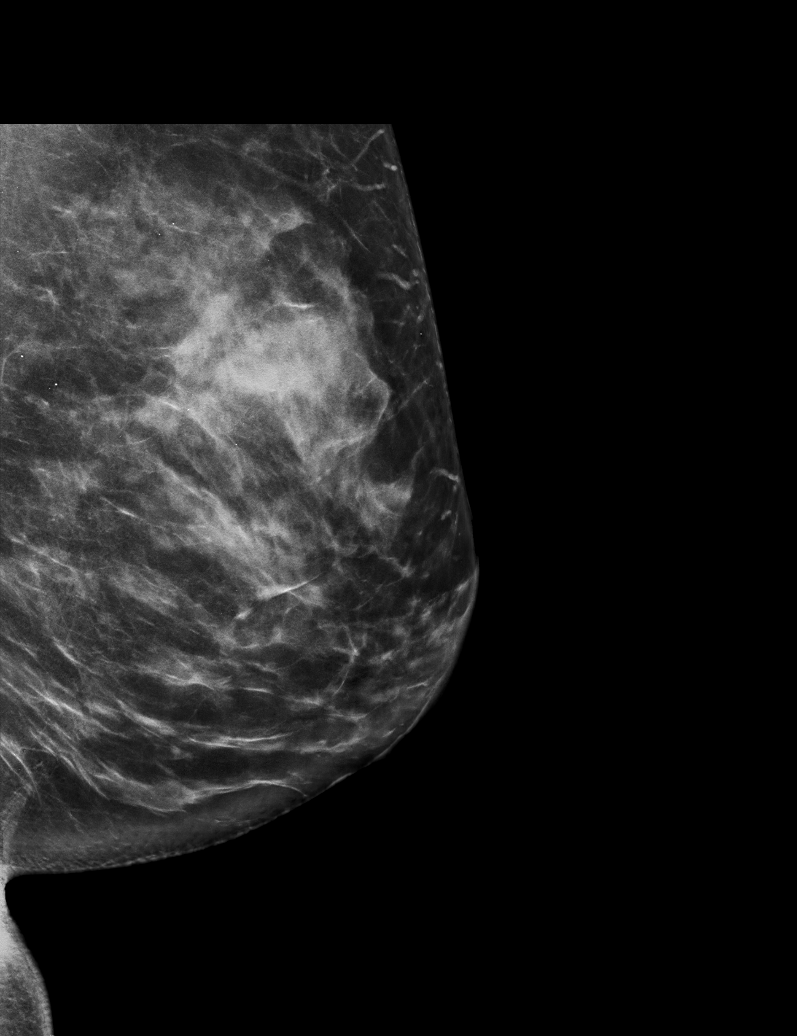

[L MLO synth-2D]
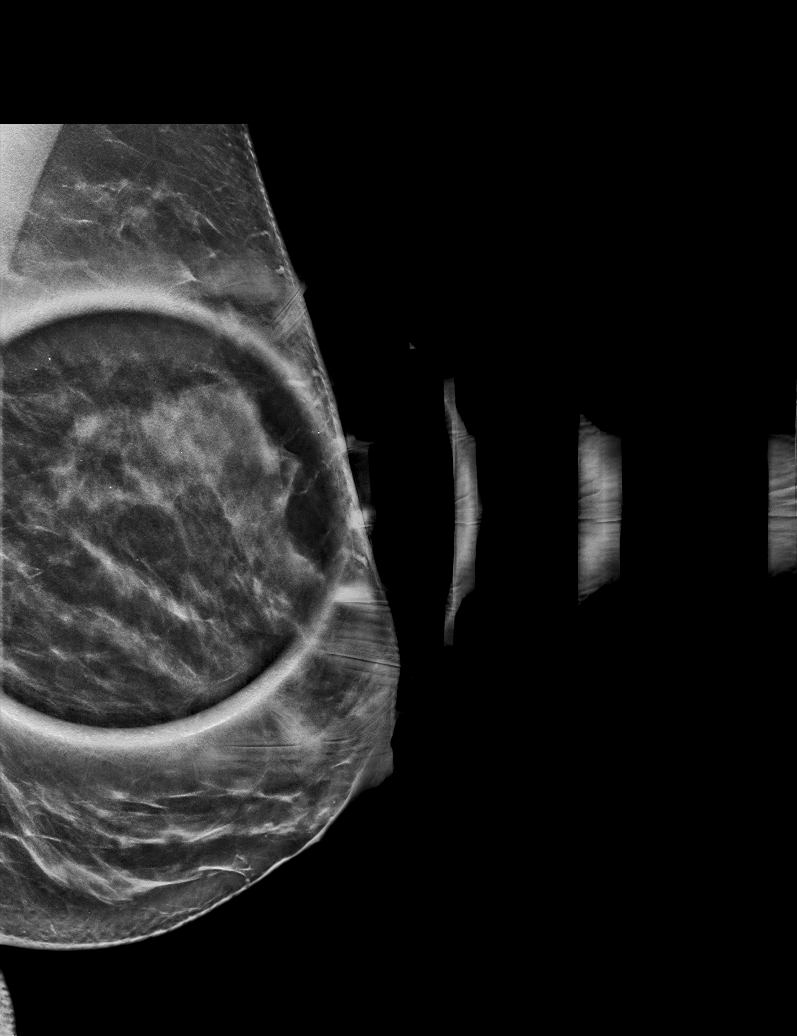

[L CC synth-2D]
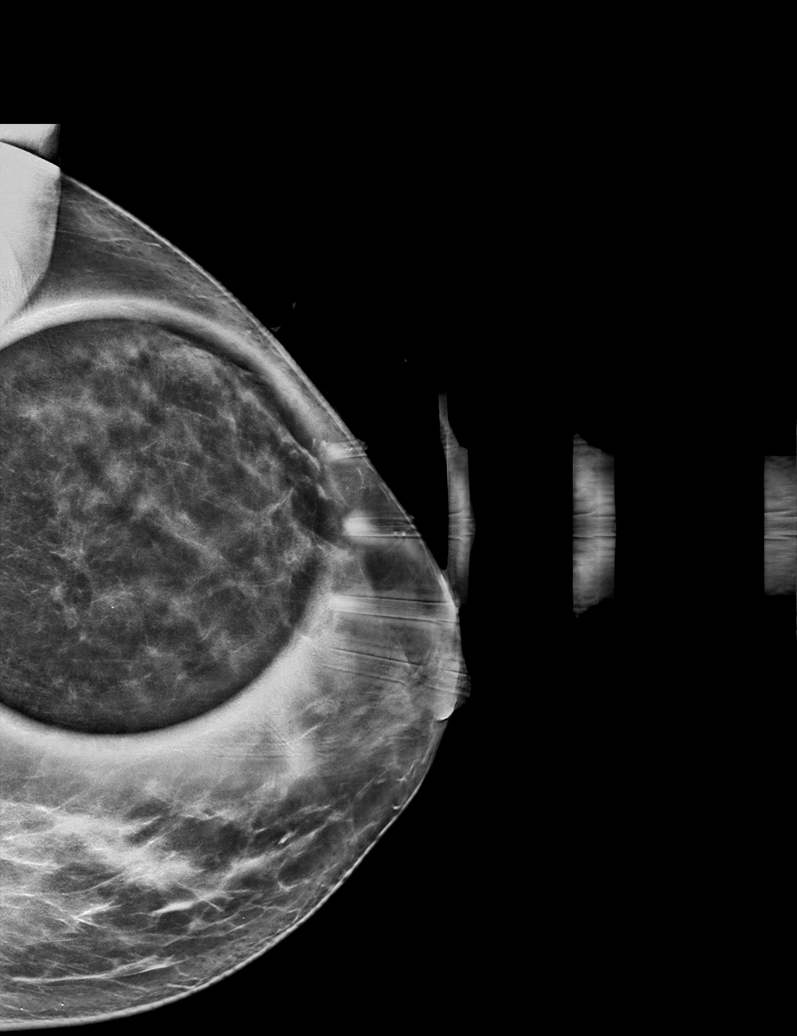

[L MLO tomo · tomo slice 37/74.0]
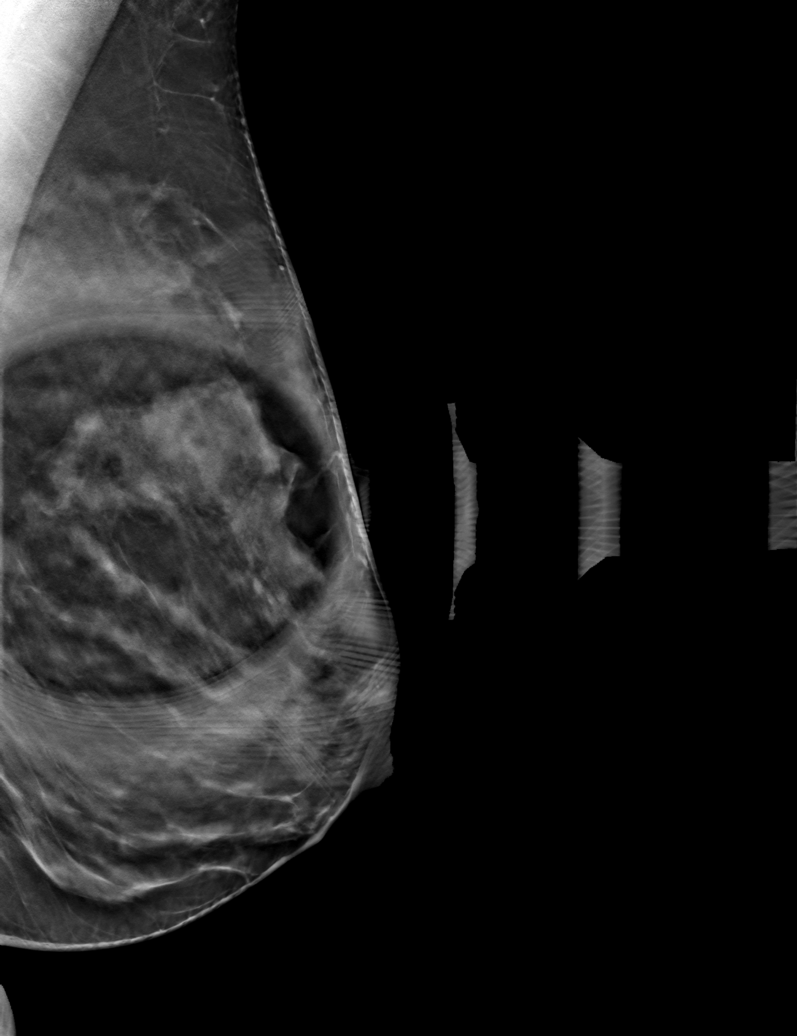

[L ML tomo · tomo slice 43/86.0]
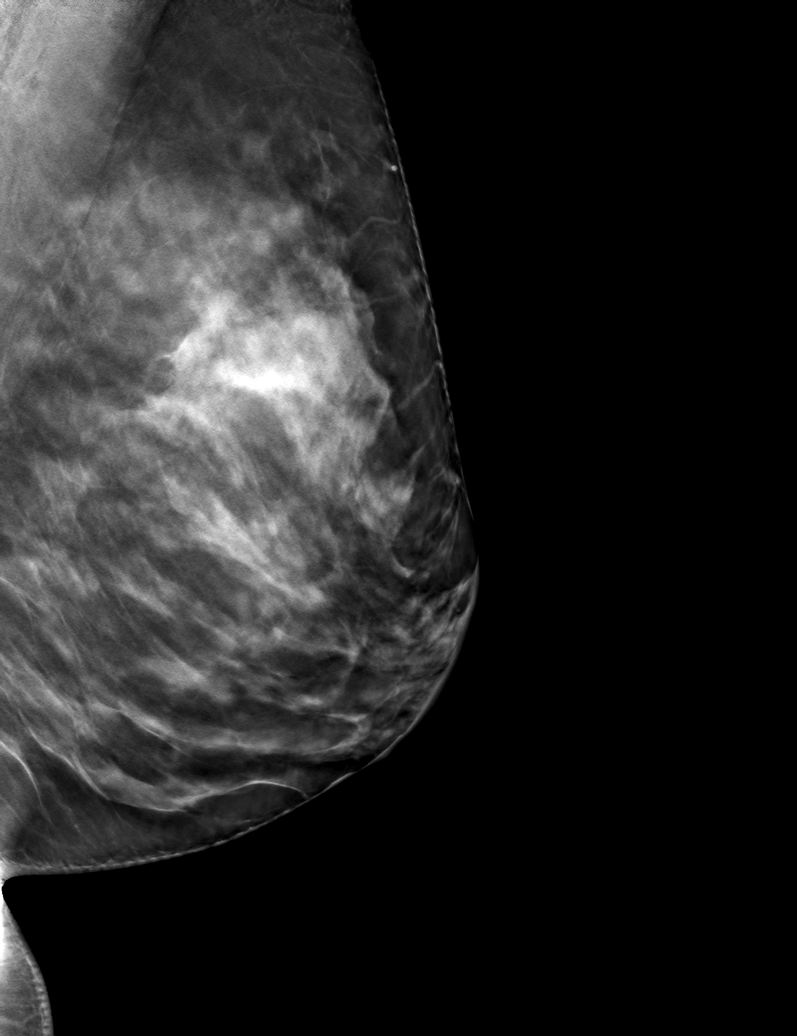

[L CC tomo · tomo slice 37/74.0]
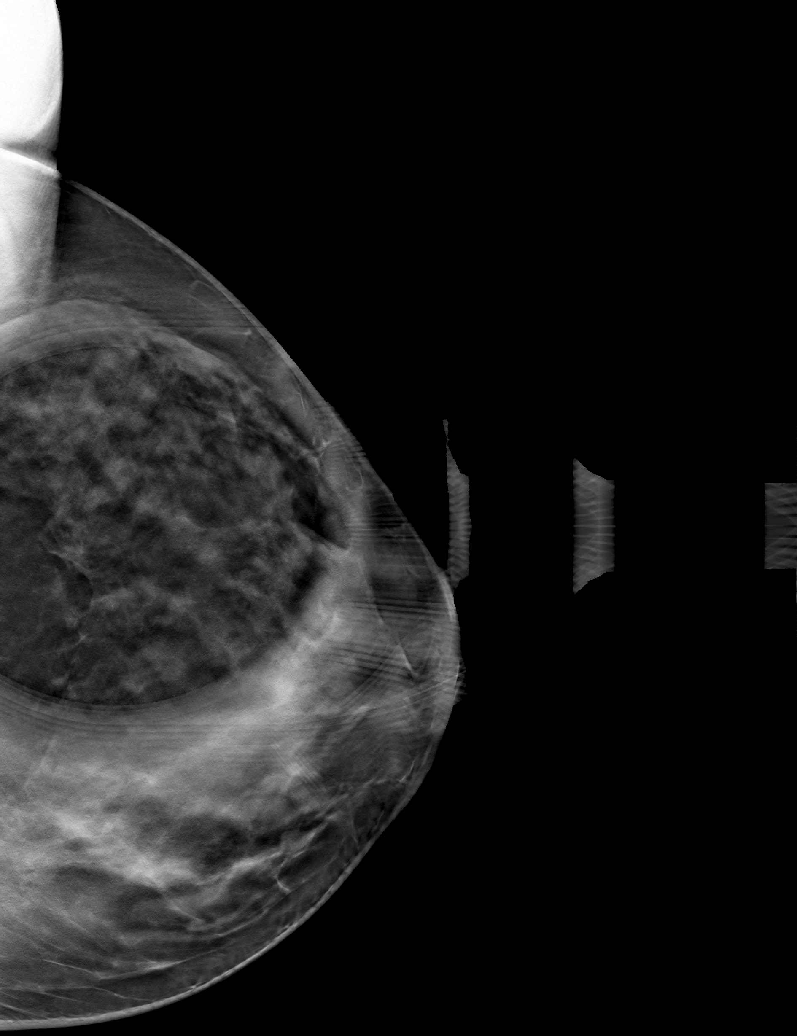

[6 of 18 positions shown; findings below may reference images not displayed]

ACR Breast Density Category c: The breast tissue is heterogeneously
dense, which may obscure small masses.
FINDINGS: Spot compression tomosynthesis images through the upper slightly
outer quadrant of the left breast demonstrates a subtle appearance
of distortion.

Ultrasound targeted to the left breast at 1 o'clock, 5 cm from the
nipple demonstrates a benign anechoic oval circumscribed mass
measuring 1.0 x 0.7 x 0.9 cm. There is an adjacent probable cluster
of cysts measuring 0.8 x 0.3 x 0.5 cm. Ultrasound of the left axilla
demonstrates multiple normal-appearing lymph nodes.
IMPRESSION: 1. There is an indeterminate subtle distortion in the upper-outer
quadrant of the left breast without a sonographic correlate.

2. There is a likely benign cluster of cysts in the left breast at 1
o'clock.

3.  No evidence of left axillary lymphadenopathy.

RECOMMENDATION:
Stereotactic biopsy is recommended for the left breast distortion.
This has been scheduled for 05/24/2022 at [DATE] a.m.

I have discussed the findings and recommendations with the patient.
If applicable, a reminder letter will be sent to the patient
regarding the next appointment.

BI-RADS CATEGORY  4: Suspicious.

## 2023-04-01 ENCOUNTER — Ambulatory Visit: Payer: BC Managed Care – PPO | Admitting: Obstetrics and Gynecology
